# Patient Record
Sex: Female | Born: 1980 | Hispanic: Yes | Marital: Single | Smoking: Former smoker
Health system: Southern US, Community
[De-identification: ages and names within clinical notes are randomized; demographics above are authoritative.]

## PROBLEM LIST (undated history)

## (undated) ENCOUNTER — Inpatient Hospital Stay (HOSPITAL_COMMUNITY): Payer: Self-pay

## (undated) DIAGNOSIS — Z789 Other specified health status: Secondary | ICD-10-CM

## (undated) DIAGNOSIS — K8020 Calculus of gallbladder without cholecystitis without obstruction: Secondary | ICD-10-CM

---

## 2015-04-06 ENCOUNTER — Other Ambulatory Visit (HOSPITAL_COMMUNITY): Payer: Self-pay | Admitting: Nurse Practitioner

## 2015-04-06 DIAGNOSIS — Z3A13 13 weeks gestation of pregnancy: Secondary | ICD-10-CM

## 2015-04-06 DIAGNOSIS — Z3682 Encounter for antenatal screening for nuchal translucency: Secondary | ICD-10-CM

## 2015-04-06 LAB — OB RESULTS CONSOLE HGB/HCT, BLOOD
HCT: 38 %
Hemoglobin: 12.2 g/dL

## 2015-04-06 LAB — OB RESULTS CONSOLE GC/CHLAMYDIA
Chlamydia: NEGATIVE
Gonorrhea: NEGATIVE

## 2015-04-06 LAB — OB RESULTS CONSOLE ABO/RH: RH TYPE: POSITIVE

## 2015-04-06 LAB — URINE DRUG PANEL 7: Drug Screen, Urine: NEGATIVE

## 2015-04-06 LAB — CYTOLOGY - PAP: CYTOLOGY - PAP: NEGATIVE

## 2015-04-06 LAB — GLUCOSE TOLERANCE, 1 HOUR (50G) W/O FASTING: GLUCOSE 1 HR PRENATAL, POC: 97 mg/dL

## 2015-04-06 LAB — OB RESULTS CONSOLE PLATELET COUNT: PLATELETS: 295 10*3/uL

## 2015-04-06 LAB — SICKLE CELL SCREEN: Sickle Cell Screen: NORMAL

## 2015-04-06 LAB — OB RESULTS CONSOLE HIV ANTIBODY (ROUTINE TESTING): HIV: NONREACTIVE

## 2015-04-06 LAB — OB RESULTS CONSOLE ANTIBODY SCREEN: ANTIBODY SCREEN: NEGATIVE

## 2015-04-06 LAB — CULTURE, OB URINE: URINE CULTURE, OB: NO GROWTH

## 2015-04-06 LAB — OB RESULTS CONSOLE RUBELLA ANTIBODY, IGM: RUBELLA: IMMUNE

## 2015-04-06 LAB — OB RESULTS CONSOLE VARICELLA ZOSTER ANTIBODY, IGG: VARICELLA IGG: IMMUNE

## 2015-04-06 LAB — CYSTIC FIBROSIS DIAGNOSTIC STUDY: Interpretation-CFDNA:: NEGATIVE

## 2015-04-18 ENCOUNTER — Encounter (HOSPITAL_COMMUNITY): Payer: Self-pay

## 2015-04-18 ENCOUNTER — Ambulatory Visit (HOSPITAL_COMMUNITY)
Admission: RE | Admit: 2015-04-18 | Discharge: 2015-04-18 | Disposition: A | Discharge status: Home / Self Care | Payer: Self-pay | Source: Ambulatory Visit | Attending: Nurse Practitioner | Admitting: Nurse Practitioner

## 2015-04-18 DIAGNOSIS — O09521 Supervision of elderly multigravida, first trimester: Secondary | ICD-10-CM | POA: Insufficient documentation

## 2015-04-18 DIAGNOSIS — Z3682 Encounter for antenatal screening for nuchal translucency: Secondary | ICD-10-CM

## 2015-04-18 DIAGNOSIS — Z36 Encounter for antenatal screening of mother: Secondary | ICD-10-CM | POA: Insufficient documentation

## 2015-04-18 DIAGNOSIS — Z3A13 13 weeks gestation of pregnancy: Secondary | ICD-10-CM | POA: Insufficient documentation

## 2015-04-18 DIAGNOSIS — O3421 Maternal care for scar from previous cesarean delivery: Secondary | ICD-10-CM | POA: Insufficient documentation

## 2015-04-18 LAB — FIRST TRIMESTER SCREEN W/NT: FIRST TRIMESTER SAMPLE: NEGATIVE

## 2015-04-22 ENCOUNTER — Encounter (HOSPITAL_COMMUNITY): Payer: Self-pay | Admitting: *Deleted

## 2015-04-22 ENCOUNTER — Inpatient Hospital Stay (HOSPITAL_COMMUNITY)
Admission: AD | Admit: 2015-04-22 | Discharge: 2015-04-22 | Disposition: A | Discharge status: Home / Self Care | Payer: Self-pay | Source: Ambulatory Visit | Attending: Obstetrics and Gynecology | Admitting: Obstetrics and Gynecology

## 2015-04-22 DIAGNOSIS — O2342 Unspecified infection of urinary tract in pregnancy, second trimester: Secondary | ICD-10-CM | POA: Insufficient documentation

## 2015-04-22 DIAGNOSIS — Z87891 Personal history of nicotine dependence: Secondary | ICD-10-CM | POA: Insufficient documentation

## 2015-04-22 DIAGNOSIS — Z3A14 14 weeks gestation of pregnancy: Secondary | ICD-10-CM | POA: Insufficient documentation

## 2015-04-22 HISTORY — DX: Other specified health status: Z78.9

## 2015-04-22 LAB — WET PREP, GENITAL
Clue Cells Wet Prep HPF POC: NONE SEEN
TRICH WET PREP: NONE SEEN
Yeast Wet Prep HPF POC: NONE SEEN

## 2015-04-22 LAB — URINE MICROSCOPIC-ADD ON

## 2015-04-22 LAB — URINALYSIS, ROUTINE W REFLEX MICROSCOPIC
BILIRUBIN URINE: NEGATIVE
GLUCOSE, UA: NEGATIVE mg/dL
KETONES UR: NEGATIVE mg/dL
NITRITE: NEGATIVE
PH: 6.5 (ref 5.0–8.0)
PROTEIN: 30 mg/dL — AB
Specific Gravity, Urine: 1.015 (ref 1.005–1.030)
UROBILINOGEN UA: 0.2 mg/dL (ref 0.0–1.0)

## 2015-04-22 MED ORDER — CEPHALEXIN 500 MG PO CAPS: 500.0000 mg | ORAL_CAPSULE | Freq: Four times a day (QID) | ORAL | Status: DC

## 2015-04-22 NOTE — Progress Notes (Signed)
Assisted RN with interpretation of exam.  Spanish Interpreter

## 2015-04-22 NOTE — Discharge Instructions (Signed)
Infección urinaria  °(Urinary Tract Infection) ° Una infección urinaria puede ocurrir en cualquier lugar del tracto urinario. El tracto incluye los riñones, uréteres, la vejiga y la uretra. La causa es un germen llamado bacteria. La infección urinaria mejora con antibióticos.  °CUIDADOS EN EL HOGAR  °· Si le recetaron antibióticos, tómelos como le haya indicado el médico. Tómelos todos, aunque se sienta mejor. °· Beba gran cantidad de líquido para mantener el pis (orina) de tono claro o amarillo pálido. °· Evite el té, las bebidas con cafeína y las bebidas gaseosas (carbonatada). °· Orine con frecuencia. Evite retener la orina durante mucho tiempo. °· Orine antes y después de tener sexo (relaciones sexuales). °· Si es mujer, higienícese desde adelante hacia atrás después de ir de cuerpo (mover el intestino). Use sólo un papel tissue por vez. °SOLICITE AYUDA DE INMEDIATO SI:  °· Siente dolor en la espalda. °· Siente un dolor en el vientre (abdominal) muy intenso. °· Tiene escalofríos. °· Tiene malestar estomacal (náuseas). °· Vomita. °· El ardor o las molestias al orinar no desaparecen. °· Tiene fiebre. °· Los síntomas no mejoran después de 3 días. °ASEGÚRESE DE QUE:  °· Comprende estas instrucciones. °· Controlará su enfermedad. °· Solicitará ayuda de inmediato si no mejora o si empeora. °Document Released: 04/17/2010 Document Revised: 07/22/2012 °ExitCare® Patient Information ©2015 [employer] LLC. This information is not intended to replace advice given to you by your health care provider. Make sure you discuss any questions you have with your health care provider. ° °

## 2015-04-22 NOTE — Progress Notes (Signed)
Assisted Pharmacy tech with interpretation of medication questions.  Spanish Interpreter

## 2015-04-22 NOTE — MAU Provider Note (Signed)
History     CSN: [id number]  Arrival date and time: 04/22/15 1811   First Provider Initiated Contact with Patient 04/22/15 1840      Chief Complaint  Patient presents with  . Dysuria   HPI  [name] is a 34 y.o. G3P2002 at [redacted]w[redacted]d who presents to MAU today with complaint of dysuria x 1 day. She also states pelvic pain which initially was only with urination is now constant x 2 hours. She rates pain at 10/10 now. She has not taken anything for pain. She denies N/V/D, hematuria or vaginal bleeding.   OB History    Gravida Para Term Preterm AB TAB SAB Ectopic Multiple Living   3 2 2  0 0 0 0 0 0 2      Past Medical History  Diagnosis Date  . Medical history non-contributory     Past Surgical History  Procedure Laterality Date  . Cesarean section      History reviewed. No pertinent family history.  History  Substance Use Topics  . Smoking status: Former Smoker    Quit date: 11/21/2012  . Smokeless tobacco: Not on file  . Alcohol Use: No    Allergies: No Known Allergies  Prescriptions prior to admission  Medication Sig Dispense Refill Last Dose  . Prenatal Vit-Fe Fumarate-FA (PRENATAL MULTIVITAMIN) TABS tablet Take 1 tablet by mouth daily at 12 noon.   04/22/2015 at Unknown time    Review of Systems  Constitutional: Negative for fever and malaise/fatigue.  Gastrointestinal: Positive for abdominal pain. Negative for nausea, vomiting, diarrhea and constipation.  Genitourinary: Positive for dysuria. Negative for urgency, frequency, hematuria and flank pain.       Neg - vaginal bleeding, discharge   Physical Exam   Blood pressure 125/74, pulse 86, temperature 98.7 F (37.1 C), resp. rate 18, last menstrual period 01/12/2015.  Physical Exam  Nursing note and vitals reviewed. Constitutional: She is oriented to person, place, and time. She appears well-developed and well-nourished. No distress.  HENT:  Head: Normocephalic and atraumatic.   Cardiovascular: Normal rate.   Respiratory: Effort normal.  GI: Soft. She exhibits no distension and no mass. There is no tenderness. There is no rebound, no guarding and no CVA tenderness.  Genitourinary: Uterus is enlarged (appropriate for GA). Uterus is not tender. Cervix exhibits no motion tenderness, no discharge and no friability. Right adnexum displays no mass and no tenderness. Left adnexum displays no mass and no tenderness. No bleeding in the vagina. Vaginal discharge (small amount of thin, white discharge noted) found.  Neurological: She is alert and oriented to person, place, and time.  Skin: Skin is warm and dry. No erythema.  Psychiatric: She has a normal mood and affect.   Results for orders placed or performed during the hospital encounter of 04/22/15 (from the past 24 hour(s))  Urinalysis, Routine w reflex microscopic (not at [facility])     Status: Abnormal   Collection Time: 04/22/15  6:30 PM  Result Value Ref Range   Color, Urine YELLOW YELLOW   APPearance CLEAR CLEAR   Specific Gravity, Urine 1.015 1.005 - 1.030   pH 6.5 5.0 - 8.0   Glucose, UA NEGATIVE NEGATIVE mg/dL   Hgb urine dipstick MODERATE (A) NEGATIVE   Bilirubin Urine NEGATIVE NEGATIVE   Ketones, ur NEGATIVE NEGATIVE mg/dL   Protein, ur 30 (A) NEGATIVE mg/dL   Urobilinogen, UA 0.2 0.0 - 1.0 mg/dL   Nitrite NEGATIVE NEGATIVE   Leukocytes, UA MODERATE (A) NEGATIVE  Urine  microscopic-add on     Status: Abnormal   Collection Time: 04/22/15  6:30 PM  Result Value Ref Range   Squamous Epithelial / LPF FEW (A) RARE   WBC, UA 7-10 <3 WBC/hpf   RBC / HPF 11-20 <3 RBC/hpf   Bacteria, UA FEW (A) RARE    MAU Course  Procedures None  MDM FHR - 171 bpm with doppler UA and wet prep today Urine culture ordered  Assessment and Plan  A: SIUP at [redacted]w[redacted]d UTI in pregnancy  P: Discharge home Rx for Keflex given to patient Second trimester precautions discussed Patient advised to follow-up with [facility] as scheduled  for routine prenatal care Patient may return to MAU as needed or if her condition were to change or worsen   Marny Lowenstein, PA-C  04/22/2015, 8:07 PM

## 2015-04-22 NOTE — Progress Notes (Signed)
Assisted RN with interpretation of discharge instructions.  °Spanish Interpreter  °

## 2015-04-22 NOTE — MAU Note (Signed)
Pt reports urinary frequency and pain for the last 2 hours.

## 2015-04-22 NOTE — Progress Notes (Signed)
Assisted RN with interpretation of triage questions.  °Spanish Interpreter  °

## 2015-04-22 NOTE — Progress Notes (Signed)
Assisted Registration with interpretation of admission information.  Spanish interpreter

## 2015-04-24 LAB — CULTURE, OB URINE: Colony Count: >=100000

## 2015-05-03 ENCOUNTER — Other Ambulatory Visit (HOSPITAL_COMMUNITY): Payer: Self-pay | Admitting: Family

## 2015-05-04 ENCOUNTER — Other Ambulatory Visit (HOSPITAL_COMMUNITY): Payer: Self-pay | Admitting: Nurse Practitioner

## 2015-05-04 DIAGNOSIS — IMO0002 Reserved for concepts with insufficient information to code with codable children: Secondary | ICD-10-CM

## 2015-05-04 DIAGNOSIS — Z0489 Encounter for examination and observation for other specified reasons: Secondary | ICD-10-CM

## 2015-05-04 LAB — AFP, QUAD SCREEN: AFP, SERUM MAT SCREEN: NEGATIVE

## 2015-05-25 ENCOUNTER — Ambulatory Visit (HOSPITAL_COMMUNITY)
Admission: RE | Admit: 2015-05-25 | Discharge: 2015-05-25 | Disposition: A | Discharge status: Home / Self Care | Payer: Self-pay | Source: Ambulatory Visit | Attending: Nurse Practitioner | Admitting: Nurse Practitioner

## 2015-05-25 ENCOUNTER — Other Ambulatory Visit (HOSPITAL_COMMUNITY): Payer: Self-pay | Admitting: Nurse Practitioner

## 2015-05-25 DIAGNOSIS — Z0489 Encounter for examination and observation for other specified reasons: Secondary | ICD-10-CM

## 2015-05-25 DIAGNOSIS — IMO0002 Reserved for concepts with insufficient information to code with codable children: Secondary | ICD-10-CM

## 2015-05-25 DIAGNOSIS — Z36 Encounter for antenatal screening of mother: Secondary | ICD-10-CM | POA: Insufficient documentation

## 2015-05-25 DIAGNOSIS — Z3A00 Weeks of gestation of pregnancy not specified: Secondary | ICD-10-CM | POA: Insufficient documentation

## 2015-06-11 ENCOUNTER — Inpatient Hospital Stay (HOSPITAL_COMMUNITY)
Admission: AD | Admit: 2015-06-11 | Discharge: 2015-06-11 | Disposition: A | Discharge status: Home / Self Care | Payer: Self-pay | Source: Ambulatory Visit | Attending: Family Medicine | Admitting: Family Medicine

## 2015-06-11 ENCOUNTER — Encounter (HOSPITAL_COMMUNITY): Payer: Self-pay | Admitting: *Deleted

## 2015-06-11 DIAGNOSIS — Z87891 Personal history of nicotine dependence: Secondary | ICD-10-CM | POA: Insufficient documentation

## 2015-06-11 DIAGNOSIS — Z3A21 21 weeks gestation of pregnancy: Secondary | ICD-10-CM | POA: Insufficient documentation

## 2015-06-11 DIAGNOSIS — O2342 Unspecified infection of urinary tract in pregnancy, second trimester: Secondary | ICD-10-CM | POA: Insufficient documentation

## 2015-06-11 LAB — CBC
HCT: 33.8 % — ABNORMAL LOW (ref 36.0–46.0)
Hemoglobin: 11.2 g/dL — ABNORMAL LOW (ref 12.0–15.0)
MCH: 26.4 pg (ref 26.0–34.0)
MCHC: 33.1 g/dL (ref 30.0–36.0)
MCV: 79.7 fL (ref 78.0–100.0)
PLATELETS: 291 10*3/uL (ref 150–400)
RBC: 4.24 MIL/uL (ref 3.87–5.11)
RDW: 14 % (ref 11.5–15.5)
WBC: 16.3 10*3/uL — ABNORMAL HIGH (ref 4.0–10.5)

## 2015-06-11 LAB — URINALYSIS, ROUTINE W REFLEX MICROSCOPIC
Bilirubin Urine: NEGATIVE
GLUCOSE, UA: NEGATIVE mg/dL
KETONES UR: NEGATIVE mg/dL
NITRITE: POSITIVE — AB
PH: 7.5 (ref 5.0–8.0)
Protein, ur: 100 mg/dL — AB
SPECIFIC GRAVITY, URINE: 1.02 (ref 1.005–1.030)
Urobilinogen, UA: 0.2 mg/dL (ref 0.0–1.0)

## 2015-06-11 LAB — URINE MICROSCOPIC-ADD ON

## 2015-06-11 MED ORDER — NITROFURANTOIN MONOHYD MACRO 100 MG PO CAPS: 100.0000 mg | ORAL_CAPSULE | Freq: Two times a day (BID) | ORAL | Status: DC

## 2015-06-11 MED ORDER — CEPHALEXIN 500 MG PO CAPS: 500.0000 mg | ORAL_CAPSULE | Freq: Four times a day (QID) | ORAL | Status: DC

## 2015-06-11 MED ORDER — CEFTRIAXONE SODIUM 1 G IJ SOLR
1.0000 g | Freq: Once | INTRAMUSCULAR | Status: AC
  Administered 2015-06-11: 1 g via INTRAMUSCULAR
  Filled 2015-06-11: qty 10

## 2015-06-11 NOTE — Discharge Instructions (Signed)
Please return here to hospital in 24 hours (Monday morning around 8am) for re-evaluation If you develop a fever or chills, nausea and vomiting or severe back/flank pain, please return earlier  [place] e infeccin del tracto urinario (Pregnancy and Urinary Tract Infection) Neomia Dear infeccin urinaria (IU) puede ocurrir en cualquier lugar del tracto urinario. La infeccin urinaria puede [employer] utteres, los riones (pielonefritis), la vejiga (cistitis) y Engineer, mining (uretritis). Todas las mujeres embarazadas deben ser estudiadas para diagnosticar la presencia de bacterias en el tracto urinario. La identificacin y el tratamiento de una infeccin urinaria disminuye el riesgo de un parto prematuro y de Environmental education officer infecciones ms graves en la madre y el beb. CAUSAS Las bacterias causan casi todas las infecciones urinarias.  FACTORES DE RIESGO Hay muchos factores que pueden aumentar sus probabilidades de contraer una infeccin urinaria (IU) durante el [place]. Pueden ser:  Winferd Humphrey uretra corta.  Falta de aseo y malos hbitos de higiene.  Las [employer].  Obstruccin de la orina en el tracto urinario.  Problemas con los msculos o nervios plvicos.  Diabetes.  Obesidad.  Problemas en la vejiga despus de tener varios hijos.  Antecedentes de infeccin urinaria. SIGNOS Y SNTOMAS   Dolor, ardor o sensacin de ardor al [employer].  Sentir la necesidad de Geographical information systems officer de inmediato [place]).  Prdida del control vesical (incontinencia urinaria).  Orinar con ms frecuencia de lo comn en el embarazo.  Malestar en la zona inferior del abdomen o en la espalda.  Mason Jim turbia.  Sangre en la orina (hematuria).  Grant Ruts. Cuando se infectan los riones, los sntomas pueden ser:  Dolor de espalda.  Dolor lateral en el lado derecho ms que en el lado izquierdo.  Grant Ruts.  Escalofros.  Nuseas.  Vmitos. DIAGNSTICO  Una infeccin del tracto urinario se suele diagnosticar  a travs de la orina. A veces se realizan pruebas y procedimientos adicionales. Estos pueden ser:  [place] de los riones, los urteres, la vejiga y Engineer, mining.  Observar la vejiga con un tubo que ilumina (cistoscopa). TRATAMIENTO Por lo general, las IU pueden tratarse con medicamentos antibiticos.  INSTRUCCIONES PARA EL CUIDADO EN EL HOGAR   Tome slo medicamentos de venta libre o recetados, segn las indicaciones del mdico. Si le han recetado antibiticos, tmelos segn las indicaciones. Tmelos todos, aunque se sienta mejor.  Beba suficiente lquido para Photographer orina clara o de color amarillo plido.  No tenga relaciones sexuales hasta que la infeccin haya desaparecido o el mdico la autorice.  Asegrese de [employer] hagan estudios para Engineer, manufacturing una infeccin urinaria durante el [place]. Estas infecciones suelen reaparecer. Para prevenir una infeccin urinaria en el futuro  Practique buenos hbitos higinicos. Siempre debe limpiarse desde adelante hacia atrs. Use el tissue slo una vez.  No retenga la orina. Orine tan pronto como sea posible cuando tenga ganas.  No se haga duchas vaginales ni use desodorantes en aerosol.  Lave con agua tibia y jabn alrededor de la zona genital y el ano.  Vace la vejiga antes y despus de Management consultant.  Use ropa interior con algodn en la entrepierna.  Evite la cafena y las [address]. Estas sustancias irritan la vejiga.  Beba jugo de arndanos o tome comprimidos de arndano. Esto puede disminuir el riesgo de sufrir una infeccin urinaria.  No beba alcohol.  Cumpla con las visitas de control y hgase todos los anlisis segn lo programado. SOLICITE ATENCIN MDICA SI:   Los sntomas empeoran.  Tiene fiebre an despus de  2 das de [address].  Tiene una erupcin.  Siente que usted tiene problemas con los medicamentos recetados.  Tiene flujo vaginal anormal. SOLICITE ATENCIN MDICA DE  INMEDIATO SI:   Siente dolor en la espalda o a los lados.  Tiene escalofros.  Observa sangre en la orina.  Tiene nuseas o vmitos.  Siente contracciones en el tero.  Tiene una perdida de lquido en chorro por la vagina. ASEGRESE DE QUE:  Comprende estas instrucciones.   Controlar su afeccin.   Recibir ayuda de inmediato si no mejora o si empeora.  Document Released: 07/22/2012 Document Revised: 08/18/2013 [facility] Patient Information 2015 [place], [place]. This information is not intended to replace advice given to you by your health care provider. Make sure you discuss any questions you have with your health care provider.  Pielonefritis - Adultos  (Pyelonephritis, Adult)  La pielonefritis es una infeccin del rin. Hay dos tipos principales de pielonefritis:   Una infeccin que se inicia rpidamente sin sntomas previos (pielonefritis [place]).  Infecciones que persisten por un largo perodo (pielonefritis crnica). CAUSAS  Hay dos causas principales:   Pasaje de bacterias desde la vejiga al rin. Este problema aparece especialmente en mujeres embarazadas. La orina en la vejiga puede infectarse por diferentes causas, por ejemplo:  Inflamacin de la prstata (prostatitis).  Durante las [employer].  Infeccin en la vejiga (cistitis).  Pasaje de bacterias desde la sangre hacia el rin. Las enfermedades que aumentan el riesgo son:   Diabetes.  Clculos renales o en la vescula.  Cncer.  Un catter colocado en la vejiga.  Otras anormalidades del rin o de Engineer, mining. SNTOMAS   Dolor abdominal  Dolor en la zona del costado o flanco.  Grant Ruts.  Escalofros.  Malestar estomacal.  Sangre en la orina Larose Kells).  Necesidad frecuente de orinar  Necesidad intensa o persistente de Geographical information systems officer.  Sensacin de ardor o pinchazos al [employer]. DIAGNSTICO  El mdico diagnosticar una infeccin en su rin basndose en los  sntomas. Tambin tomar [place].  TRATAMIENTO  Generalmente el tratamiento depende de la gravedad de la infeccin.   Si la infeccin es leve y se diagnostica a tiempo, el mdico lo tratar con antibiticos por va oral y lo dejar irse a su casa.  Si la infeccin es ms grave, la bacteria podra haber ingresado al torrente sanguneo. Esto requerir antibiticos por va intravenosa y Administrator, arts en el hospital. Los sntomas pueden incluir:  Fiebre alta.  Dolor intenso en un costado del cuerpo.  Escalofros  An despus de Financial risk analyst hospital, el mdico podr indicarle antibiticos por va oral durante cierto perodo de [place].  Podr prescribirle otros tratamientos segn la causa de la infeccin. INSTRUCCIONES PARA EL CUIDADO EN EL HOGAR   Tome los antibiticos como se le indic. Tmelos todos, aunque se sienta mejor.  Concurra para Education officer, environmental un control y asegurarse de que la infeccin ha desaparecido.  Debe ingerir gran cantidad de lquido para mantener la orina de tono claro o color amarillo plido.  Tome medicamentos para la vejiga si siente urgencia para Geographical information systems officer o lo hace con mucha frecuencia. SOLICITE ATENCIN MDICA DE INMEDIATO SI:   Tiene fiebre o sntomas persistentes durante ms de 2  3 das.  Tiene fiebre y los sntomas [address].  No puede tomar los antibiticos ni ingerir lquidos.  Comienza a sentir escalofros.  Siente debilidad extrema o se desmaya.  No mejora despus de 2 das de [place]. ASEGRESE  DE QUE:   Comprende estas instrucciones.  Controlar su enfermedad.  Solicitar ayuda de inmediato si no mejora o empeora. Document Released: 08/07/2005 Document Revised: 04/28/2012 [facility] Patient Information 2015 [place], [place]. This information is not intended to replace advice given to you by your health care provider. Make sure you discuss any questions you have with your health care provider.

## 2015-06-11 NOTE — MAU Note (Signed)
EXPLAINED  TO PT  WITH INTERPRETER-   OF WATCHING X2 MORE HRS  FOR INCREASE TEMP

## 2015-06-11 NOTE — MAU Provider Note (Signed)
History  Chief Complaint:  No chief complaint on file.  Madeline Stark is a 34 y.o. G55P2002 female at [redacted]w[redacted]d presenting w/ report of constant lower abdominal pain, low back pain, & urinary frequency/dysuria x 1 day.  Denies fever/chills, n/v.  Reports active fetal movement, contractions: none, vaginal bleeding: none, membranes: intact. Denies abnormal/malodorous vag d/c or vulvovaginal itching/irritation.   Prenatal care at HD.  Next visit 8/15. Pregnancy complicated by UTI in June, tx w/ keflex and pt feels like it did resolve, 2 prev c/s in [place], language barrier.   Obstetrical History: OB History    Gravida Para Term Preterm AB TAB SAB Ectopic Multiple Living   0 0 0 0 0 0 2      Past Medical History: Past Medical History  Diagnosis Date  . Medical history non-contributory     Past Surgical History: Past Surgical History  Procedure Laterality Date  . Cesarean section      Social History: History   Social History  . Marital Status: Single    Spouse Name: N/A  . Number of Children: N/A  . Years of Education: N/A   Social History Main Topics  . Smoking status: Former Smoker    Quit date: 11/21/2012  . Smokeless tobacco: Not on file  . Alcohol Use: No  . Drug Use: No  . Sexual Activity: No   Other Topics Concern  . None   Social History Narrative    Allergies: No Known Allergies  Prescriptions prior to admission  Medication Sig Dispense Refill Last Dose  . Prenatal Vit-Fe Fumarate-FA (PRENATAL MULTIVITAMIN) TABS tablet Take 1 tablet by mouth daily at 12 noon.   06/10/2015 at Unknown time  . cephALEXin (KEFLEX) 500 MG capsule Take 1 capsule (500 mg total) by mouth 4 (four) times daily. 28 capsule 0 More than a month at Unknown time    Review of Systems  Pertinent pos/neg as indicated in HPI  Physical Exam  Blood pressure 109/61, pulse 91, temperature 97.9 F (36.6 C), temperature source Oral, resp. rate 20, height  (1.499 m), weight  79.379 kg (175 lb), last menstrual period 01/12/2015. General appearance: alert, cooperative and no distress Lungs: clear to auscultation bilaterally, normal effort Heart: regular rate and rhythm Abdomen: gravid, soft, slightly tender Mild Rt CVAT Extremities: No edema Skin feels hot to touch, re-checked oral temp: 98.6, axillary temp 98.9  Fetal monitoring: FHR: 150 bpm, variability: moderate,  Accelerations: Present,  decelerations:  Absent Uterine activity: occ ui  MAU Course  Exam, ua, wbc 1610: Discussed w/ Dr. Shawnie Pons, can watch for a couple of more hours and make sure she doesn't spike temp, if doesn't can give Rocephin IM x 1 and send home w/ po antbiotics to f/u back in [place] in 24hr for re-eval, if she does spike temp she will be admitted 0800: Did not spike fever, will d/c home after rocephin 1gm im Labs:  Results for orders placed or performed during the hospital encounter of 06/11/15 (from the past 24 hour(s))  Urinalysis, Routine w reflex microscopic (not at [facility])     Status: Abnormal   Collection Time: 06/11/15  4:18 AM  Result Value Ref Range   Color, Urine YELLOW YELLOW   APPearance CLOUDY (A) CLEAR   Specific Gravity, Urine 1.020 1.005 - 1.030   pH 7.5 5.0 - 8.0   Glucose, UA NEGATIVE NEGATIVE mg/dL   Hgb urine dipstick LARGE (A) NEGATIVE   Bilirubin Urine NEGATIVE NEGATIVE  Ketones, ur NEGATIVE NEGATIVE mg/dL   Protein, ur 161 (A) NEGATIVE mg/dL   Urobilinogen, UA 0.2 0.0 - 1.0 mg/dL   Nitrite POSITIVE (A) NEGATIVE   Leukocytes, UA LARGE (A) NEGATIVE  Urine microscopic-add on     Status: Abnormal   Collection Time: 06/11/15  4:18 AM  Result Value Ref Range   Squamous Epithelial / LPF RARE RARE   WBC, UA TOO NUMEROUS TO COUNT <3 WBC/hpf   RBC / HPF 11-20 <3 RBC/hpf   Bacteria, UA FEW (A) RARE  CBC     Status: Abnormal   Collection Time: 06/11/15  5:10 AM  Result Value Ref Range   WBC 16.3 (H) 4.0 - 10.5 K/uL   RBC 4.24 3.87 - 5.11 MIL/uL   Hemoglobin 11.2  (L) 12.0 - 15.0 g/dL   HCT 09.6 (L) 04.5 - 40.9 %   MCV 79.7 78.0 - 100.0 fL   MCH 26.4 26.0 - 34.0 pg   MCHC 33.1 30.0 - 36.0 g/dL   RDW 81.1 91.4 - 78.2 %   Platelets 291 150 - 400 K/uL    Imaging:  n/a  Assessment and Plan  A:  [redacted]w[redacted]d SIUP  G3P2002  UTI vs. Early pyelo  Cat 1 FHR P:  D/C home after Rocephin 1gm IM  F/U here in 24hrs for re-eval per Dr. Shawnie Pons  Rx keflex qid x 10d  Reviewed ptl s/s, fm, pyleo s/s, reasons to return  Increase po fluids  Keep next appt at HD on 8/15 as scheduled   Marge Duncans CNM,WHNP-BC 7/31/20165:52 AM

## 2015-06-11 NOTE — MAU Note (Addendum)
PT  SAYS S WITH INTERPRETER- RAQUEL-    THAT RIGHT   SIDED  CONSTANT   ABD PAIN  STARTED  AT 5 PM YESTERDAY  WHEN SHE LAYED  DOWN -   PAIN IS  WORSE  NOW -  CONSTANT    ALL OVER ABD  .    [facility]  AT HD.    DENIES HSV AND MRSA.  LAST SEX-    YESTERDAY  .

## 2015-06-12 ENCOUNTER — Encounter (HOSPITAL_COMMUNITY): Payer: Self-pay | Admitting: *Deleted

## 2015-06-12 ENCOUNTER — Inpatient Hospital Stay (HOSPITAL_COMMUNITY)
Admission: AD | Admit: 2015-06-12 | Discharge: 2015-06-12 | Disposition: A | Discharge status: Home / Self Care | Payer: Self-pay | Source: Ambulatory Visit | Attending: Family Medicine | Admitting: Family Medicine

## 2015-06-12 DIAGNOSIS — Z87891 Personal history of nicotine dependence: Secondary | ICD-10-CM | POA: Insufficient documentation

## 2015-06-12 DIAGNOSIS — N390 Urinary tract infection, site not specified: Secondary | ICD-10-CM | POA: Diagnosis present

## 2015-06-12 DIAGNOSIS — Z3A21 21 weeks gestation of pregnancy: Secondary | ICD-10-CM | POA: Insufficient documentation

## 2015-06-12 DIAGNOSIS — N10 Acute tubulo-interstitial nephritis: Secondary | ICD-10-CM

## 2015-06-12 DIAGNOSIS — O2342 Unspecified infection of urinary tract in pregnancy, second trimester: Secondary | ICD-10-CM | POA: Insufficient documentation

## 2015-06-12 LAB — URINE MICROSCOPIC-ADD ON

## 2015-06-12 LAB — URINALYSIS, ROUTINE W REFLEX MICROSCOPIC
Bilirubin Urine: NEGATIVE
Glucose, UA: 100 mg/dL — AB
Hgb urine dipstick: NEGATIVE
Ketones, ur: 15 mg/dL — AB
Nitrite: NEGATIVE
Protein, ur: NEGATIVE mg/dL
Specific Gravity, Urine: 1.02 (ref 1.005–1.030)
Urobilinogen, UA: 1 mg/dL (ref 0.0–1.0)
pH: 6 (ref 5.0–8.0)

## 2015-06-12 NOTE — Discharge Instructions (Signed)
Embarazo e infeccin del tracto urinario (Pregnancy and Urinary Tract Infection) Una infeccin urinaria (IU) puede ocurrir en Clinical cytogeneticist del tracto urinario. La infeccin urinaria puede [employer] utteres, los riones (pielonefritis), la vejiga (cistitis) y Geologist, engineering (uretritis). Todas las mujeres embarazadas deben ser estudiadas para diagnosticar la presencia de bacterias en el tracto urinario. La identificacin y el tratamiento de una infeccin urinaria disminuye el riesgo de un parto prematuro y de Actor infecciones ms graves en la madre y el beb. CAUSAS Las bacterias causan casi todas las infecciones urinarias.  FACTORES DE RIESGO Hay muchos factores que pueden aumentar sus probabilidades de contraer una infeccin urinaria (IU) durante el [place]. Pueden ser:  Lucilla Edin uretra corta.  Falta de aseo y malos hbitos de higiene.  [place].  Obstruccin de la orina en el tracto urinario.  Problemas con los msculos o nervios plvicos.  Diabetes.  Obesidad.  Problemas en la vejiga despus de tener varios hijos.  Antecedentes de infeccin urinaria. SIGNOS Y SNTOMAS   Dolor, ardor o sensacin de ardor al [employer].  Sentir la necesidad de Garment/textile technologist de inmediato [place]).  Prdida del control vesical (incontinencia urinaria).  Orinar con ms frecuencia de lo comn en el embarazo.  Malestar en la zona inferior del abdomen o en la espalda.  Bennie Hind turbia.  Sangre en la orina (hematuria).  Cristy Hilts. Cuando se infectan los riones, los sntomas pueden ser:  Dolor de espalda.  Dolor lateral en el lado derecho ms que en el lado izquierdo.  Cristy Hilts.  Escalofros.  Nuseas.  Vmitos. DIAGNSTICO  Una infeccin del tracto urinario se suele diagnosticar a travs de la orina. A veces se realizan pruebas y procedimientos adicionales. Estos pueden ser:  Steward Drone de los riones, los urteres, la vejiga y Geologist, engineering.  Observar la vejiga con un  tubo que ilumina (cistoscopa). TRATAMIENTO Por lo general, las IU pueden tratarse con medicamentos antibiticos.  INSTRUCCIONES PARA EL CUIDADO EN EL HOGAR   Tome slo medicamentos de venta libre o recetados, segn las indicaciones del mdico. Si le han recetado antibiticos, tmelos segn las indicaciones. Tmelos todos, aunque se sienta mejor.  Beba suficiente lquido para Consulting civil engineer orina clara o de color amarillo plido.  No tenga relaciones sexuales hasta que la infeccin haya desaparecido o el mdico la autorice.  Asegrese de [employer] hagan estudios para Hydrographic surveyor una infeccin urinaria durante el [place]. Estas infecciones suelen reaparecer. Para prevenir una infeccin urinaria en el futuro  Practique buenos hbitos higinicos. Siempre debe limpiarse desde adelante hacia atrs. Use el tissue slo una vez.  No retenga la orina. Orine tan pronto como sea posible cuando tenga ganas.  No se haga duchas vaginales ni use desodorantes en aerosol.  Lave con agua tibia y jabn alrededor de la zona genital y el ano.  Vace la vejiga antes y despus de Clinical biochemist.  Use ropa interior con algodn en la entrepierna.  Evite la cafena y las bebidas gaseosas. Estas sustancias irritan la vejiga.  Beba jugo de arndanos o tome comprimidos de arndano. Esto puede disminuir el riesgo de sufrir una infeccin urinaria.  No beba alcohol.  Cumpla con las visitas de control y hgase todos los anlisis segn lo programado. SOLICITE ATENCIN MDICA SI:   Los sntomas empeoran.  Tiene fiebre an despus de 2 das [place].  Tiene una erupcin.  Siente que usted tiene problemas con los medicamentos recetados.  Tiene flujo vaginal anormal. SOLICITE ATENCIN MDICA DE INMEDIATO SI:  Siente dolor en la espalda o a los lados. °· Tiene escalofríos. °· Observa sangre en la orina. °· Tiene náuseas o vómitos. °· Siente contracciones en el útero. °· Tiene una  perdida de líquido en chorro por la vagina. °ASEGÚRESE DE QUE: °· Comprende estas instrucciones.   °· Controlará su afección.   °· Recibirá ayuda de inmediato si no mejora o si empeora.   °Document Released: 07/22/2012 Document Revised: 08/18/2013 °ExitCare® Patient Information ©2015 [employer]. This information is not intended to replace advice given to you by your health care provider. Make sure you discuss any questions you have with your health care provider. ° °

## 2015-06-12 NOTE — MAU Note (Signed)
Pt was here yesterday, dx'd with UTI, received Rocephin.  Was told to F/U in MAU today.  Pt states she feels better today.  Continues to have lower back pain that she rates a 5, states it was much worse yesterday.   Denies bleeding or abd pain.

## 2015-06-12 NOTE — MAU Provider Note (Signed)
History     CSN: [id number]  Arrival date and time: 06/12/15 1042   First Provider Initiated Contact with Patient 06/12/15 1234      Chief Complaint  Patient presents with  . Back Pain   HPI  Patient is 34 y.o. [id number] [redacted]w[redacted]d seen yesterday in the MAU for UTI. Was given one dose of Rocephin and sent home with Keflex Rx. There was concern for possible pyelonephritis and pt was instructed to return today for f/u.    Patient denies fever or N/V overnight. Reports that low back pain and dysuria has improved. Has taken 4 doses of Keflex thus far.   +FM, denies LOF, VB, contractions, vaginal discharge.    OB History    Gravida Para Term Preterm AB TAB SAB Ectopic Multiple Living   3 2 2  0 0 0 0 0 0 2      Past Medical History  Diagnosis Date  . Medical history non-contributory     Past Surgical History  Procedure Laterality Date  . Cesarean section      History reviewed. No pertinent family history.  History  Substance Use Topics  . Smoking status: Former Smoker    Quit date: 11/21/2012  . Smokeless tobacco: Not on file  . Alcohol Use: No    Allergies: No Known Allergies  Prescriptions prior to admission  Medication Sig Dispense Refill Last Dose  . cephALEXin (KEFLEX) 500 MG capsule Take 1 capsule (500 mg total) by mouth 4 (four) times daily. X 10days 40 capsule 0 06/12/2015 at Unknown time  . Prenatal Vit-Fe Fumarate-FA (PRENATAL MULTIVITAMIN) TABS tablet Take 1 tablet by mouth daily at 12 noon.   06/12/2015 at Unknown time    Review of Systems  Constitutional: Negative for fever, chills and malaise/fatigue.  HENT: Negative for congestion.   Eyes: Negative for blurred vision and double vision.  Respiratory: Negative for cough and shortness of breath.   Cardiovascular: Negative for chest pain, palpitations, claudication and leg swelling.  Gastrointestinal: Negative for heartburn, nausea, vomiting, abdominal pain, diarrhea and constipation.  Genitourinary: Positive  for dysuria (improving ). Negative for hematuria.  Musculoskeletal: Negative for myalgias and back pain.  Skin: Negative for itching and rash.  Neurological: Negative for dizziness, loss of consciousness and headaches.   Physical Exam   Blood pressure 115/75, pulse 102, temperature 98.3 F (36.8 C), temperature source Oral, resp. rate 16, last menstrual period 01/12/2015, SpO2 100 %.  Physical Exam  Constitutional: She is oriented to person, place, and time. She appears well-developed and well-nourished. No distress.  HENT:  Head: Normocephalic and atraumatic.  Eyes: Conjunctivae and EOM are normal.  Neck: Normal range of motion. No thyromegaly present.  Cardiovascular: Normal rate, regular rhythm and normal heart sounds.  Exam reveals no gallop and no friction rub.   No murmur heard. Respiratory: Breath sounds normal. No respiratory distress. She has no wheezes. She has no rales.  GI: Soft. Bowel sounds are normal. She exhibits no distension. There is no tenderness. There is no rebound and no guarding.  Musculoskeletal: Normal range of motion. She exhibits no edema.  No CVA tenderness   Neurological: She is alert and oriented to person, place, and time.  Skin: Skin is warm and dry. No rash noted. No erythema.  Psychiatric: She has a normal mood and affect. Her behavior is normal.  Fetal heart tones 154 bpm   MAU Course  Procedures  MDM   Assessment and Plan  Patient is 34 y.o. G3P2002 [redacted]w[redacted]d  follow up for UTI.  - fetal kick counts reinforced - preterm labor precautions -unlikely that this is pyelonephritis due to lack of CVA tenderness, afebrile status, and lack of N/V  -instructed pt to continue Keflex for full course even if she is feeling better -instructed pt to return for worsening dysuria/back pain, fevers, and N/V -stable for discharge to home   De Hollingshead 06/12/2015, 12:34 PM   OB fellow attestation: I have seen and examined this patient; I agree with  above documentation in the resident's note.   Sharday Scrivener is a 34 y.o. [id number] presenting for follow up after MAU visit over the weekend with suspected pyelonephritis. She was given Rocephin and started on Keflex.  Denies fevers, chills, nausea, vomiting. Feels much better than on Saturday.  +FM, denies LOF, VB, contractions, vaginal discharge.  PE: BP 110/68 mmHg  Pulse 92  Temp(Src) 97.9 F (36.6 C) (Oral)  Resp 18  SpO2 100%  LMP 01/12/2015 Gen: calm comfortable, NAD Resp: normal effort, no distress Abd: gravid. No CVA tenderness. No suprapubic tenderness.   ROS, labs, PMH reviewed NST reactive   Plan: Urinary tract infection: Continue keflex, no need for repeat rocephin. Discussed return precautions in detail and patient voiced understanding.   Federico Flake, MD 1:06 PM

## 2015-06-20 ENCOUNTER — Other Ambulatory Visit (HOSPITAL_COMMUNITY): Payer: Self-pay | Admitting: Urology

## 2015-06-20 DIAGNOSIS — Z0489 Encounter for examination and observation for other specified reasons: Secondary | ICD-10-CM

## 2015-06-20 DIAGNOSIS — IMO0002 Reserved for concepts with insufficient information to code with codable children: Secondary | ICD-10-CM

## 2015-06-22 ENCOUNTER — Other Ambulatory Visit (HOSPITAL_COMMUNITY): Payer: Self-pay | Admitting: *Deleted

## 2015-06-22 ENCOUNTER — Ambulatory Visit (HOSPITAL_COMMUNITY)
Admission: RE | Admit: 2015-06-22 | Discharge: 2015-06-22 | Disposition: A | Discharge status: Home / Self Care | Payer: Self-pay | Source: Ambulatory Visit | Attending: Physician Assistant | Admitting: Physician Assistant

## 2015-06-22 ENCOUNTER — Other Ambulatory Visit (HOSPITAL_COMMUNITY): Payer: Self-pay | Admitting: Urology

## 2015-06-22 DIAGNOSIS — IMO0002 Reserved for concepts with insufficient information to code with codable children: Secondary | ICD-10-CM

## 2015-06-22 DIAGNOSIS — Z0489 Encounter for examination and observation for other specified reasons: Secondary | ICD-10-CM

## 2015-06-22 DIAGNOSIS — Z36 Encounter for antenatal screening of mother: Secondary | ICD-10-CM | POA: Insufficient documentation

## 2015-07-10 ENCOUNTER — Encounter (HOSPITAL_COMMUNITY): Payer: Self-pay

## 2015-07-10 ENCOUNTER — Inpatient Hospital Stay (HOSPITAL_COMMUNITY)
Admission: AD | Admit: 2015-07-10 | Discharge: 2015-07-10 | Disposition: A | Discharge status: Home / Self Care | Payer: Self-pay | Source: Ambulatory Visit | Attending: Obstetrics & Gynecology | Admitting: Obstetrics & Gynecology

## 2015-07-10 DIAGNOSIS — Z3A25 25 weeks gestation of pregnancy: Secondary | ICD-10-CM | POA: Insufficient documentation

## 2015-07-10 DIAGNOSIS — O26892 Other specified pregnancy related conditions, second trimester: Secondary | ICD-10-CM | POA: Insufficient documentation

## 2015-07-10 DIAGNOSIS — Z87891 Personal history of nicotine dependence: Secondary | ICD-10-CM | POA: Insufficient documentation

## 2015-07-10 DIAGNOSIS — R1013 Epigastric pain: Secondary | ICD-10-CM | POA: Insufficient documentation

## 2015-07-10 LAB — COMPREHENSIVE METABOLIC PANEL
ALBUMIN: 2.9 g/dL — AB (ref 3.5–5.0)
ALK PHOS: 90 U/L (ref 38–126)
ALT: 35 U/L (ref 14–54)
AST: 31 U/L (ref 15–41)
Anion gap: 10 (ref 5–15)
BUN: 11 mg/dL (ref 6–20)
CALCIUM: 9 mg/dL (ref 8.9–10.3)
CO2: 25 mmol/L (ref 22–32)
Chloride: 102 mmol/L (ref 101–111)
Creatinine, Ser: 0.64 mg/dL (ref 0.44–1.00)
GFR calc Af Amer: >60 mL/min (ref >60)
Glucose, Bld: 109 mg/dL — ABNORMAL HIGH (ref 65–99)
Potassium: 3.3 mmol/L — ABNORMAL LOW (ref 3.5–5.1)
Sodium: 137 mmol/L (ref 135–145)
TOTAL PROTEIN: 6.4 g/dL — AB (ref 6.5–8.1)
Total Bilirubin: 0.6 mg/dL (ref 0.3–1.2)

## 2015-07-10 LAB — CBC
HCT: 33.3 % — ABNORMAL LOW (ref 36.0–46.0)
HEMOGLOBIN: 10.9 g/dL — AB (ref 12.0–15.0)
MCH: 25.8 pg — ABNORMAL LOW (ref 26.0–34.0)
MCHC: 32.7 g/dL (ref 30.0–36.0)
MCV: 78.9 fL (ref 78.0–100.0)
Platelets: 265 10*3/uL (ref 150–400)
RBC: 4.22 MIL/uL (ref 3.87–5.11)
RDW: 14 % (ref 11.5–15.5)
WBC: 10.5 10*3/uL (ref 4.0–10.5)

## 2015-07-10 LAB — TROPONIN I

## 2015-07-10 LAB — LIPASE, BLOOD: LIPASE: 22 U/L (ref 22–51)

## 2015-07-10 MED ORDER — OXYCODONE-ACETAMINOPHEN 5-325 MG PO TABS: 1.0000 | ORAL_TABLET | ORAL | Status: DC | PRN

## 2015-07-10 MED ORDER — MORPHINE SULFATE (PF) 4 MG/ML IV SOLN
1.0000 mg | Freq: Once | INTRAVENOUS | Status: AC
  Administered 2015-07-10: 1 mg via INTRAVENOUS
  Filled 2015-07-10: qty 1

## 2015-07-10 MED ORDER — ONDANSETRON 4 MG PO TBDP: 4.0000 mg | ORAL_TABLET | Freq: Three times a day (TID) | ORAL | Status: DC | PRN

## 2015-07-10 MED ORDER — CALCIUM CARBONATE ANTACID 500 MG PO CHEW: 1.0000 | CHEWABLE_TABLET | Freq: Every day | ORAL | Status: DC

## 2015-07-10 MED ORDER — LACTATED RINGERS IV BOLUS (SEPSIS)
1000.0000 mL | Freq: Once | INTRAVENOUS | Status: AC
  Administered 2015-07-10: 1000 mL via INTRAVENOUS

## 2015-07-10 MED ORDER — GI COCKTAIL ~~LOC~~
30.0000 mL | Freq: Once | ORAL | Status: AC
  Administered 2015-07-10: 30 mL via ORAL
  Filled 2015-07-10: qty 30

## 2015-07-10 NOTE — Discharge Instructions (Signed)
Cólico biliar °(Biliary Colic)  °El cólico biliar es un dolor continuo o irregular en la zona superior del abdomen. Generalmente se ubica debajo de la zona derecha de la caja torácica. Aparece cuando los cálculos biliares interfieren con el flujo normal de la bilis que proviene de la vesícula. La bilis es un líquido que interviene en la digestión de las grasas. Se produce en el hígado y se almacena en la vesícula. Al comer, La bilis pasa desde la vesícula, a través del conducto cístico y el conducto biliar común al intestino delgado. Allí se mezcla con la comida parcialmente digerida. Si un cálculo obstruye alguno de esos conductos, se detiene el flujo normal de bilis. Las células del conducto biliar se contraen con fuerza para mover el cálculo. Esto causa el dolor del cólico biliar.  °SÍNTOMAS °· El paciente se queja de dolor en la zona superior del abdomen. El dolor puede ser: °¨ En el centro de la zona superior del abdomen, justo por debajo del esternón. °¨ En la zona superior derecha del abdomen, donde se encuentra la vesícula biliar y el hígado. °¨ Se expande hacia la espalda, hacia el omóplato derecho. °· Náuseas y vómitos °· El dolor comienza generalmente después de comer. °· El cólico biliar aparece como una demanda de bilis por parte del sistema digestivo. La demanda de bilis es mayor luego de ingerir alimentos ricos en grasas. Los síntomas también pueden aparecer en personas que han estado ayunando e ingieren abruptamente una comida abundante. La mayoría de los episodios de cólico biliar mejoran luego de 1 a 5 horas. Después que se alivia el dolor más intenso, podrá seguir sintiendo un dolor moderado en el abdomen durante un lapso de 24 horas. °DIAGNÓSTICO °Luego de escuchar la descripción de los síntomas, el médico realizará un examen físico. Deberá prestar atención a la zona superior del abdomen. Esta es la zona en la que se encuentra el hígado y la vesícula biliar. El médico podrá observar los cálculos  a través de una ecografía. También le realizaran un escaneo especializado de la vesícula biliar. Le indicarán análisis de sangre, especialmente si tiene fiebre o el dolor persiste. °PREVENCIÓN °El cólico biliar puede evitarse controlando los factores de riesgo que favorecen los cálculos. Algunos de estos factores de riesgo como la herencia, el aumento de la edad y el embarazo son aspectos normales de la vida. La obesidad y una dieta rica en grasas son factores de riesgo que usted puede modificar a través de cambios hacia un estilo de vida saludable. Las mujeres que atraviesan la menopausia y que reciben terapia de reemplazo hormonal (estrógenos) también tienen más riesgo de desarrollar cólicos biliares. °TRATAMIENTO °· Le prescribirán analgésicos. °· Le indicarán una dieta sin grasas. °· Si el primer episodio es intenso, o si los cólicos aparecen nuevamente, generalmente se indica la cirugía para extirpar la vesícula (colecistectomía). Este procedimiento puede realizarse a través de pequeñas incisiones utilizando un instrumento denominado laparoscopio. Muchas veces se requiere una breve estadía en el hospital. Algunas personas reciben el alta el mismo día. Es el tratamiento más ampliamente utilizado en personas que sufren dolor por cálculos biliares. Es efectivo y seguro, no tiene complicaciones en más del 90% de los casos. °· Si la cirugía no puede llevarse a cabo, podrán utilizarse medicamentos para disolver los cálculos. Esta medicación es cara y puede demorar meses o años hasta que tenga efecto. Sólo podrá disolver cálculos pequeños. °· En algunos casos raros, se combinan estos medicamentos con un procedimiento denominado litotricia por ondas   de choque. Este procedimiento utiliza ondas de choque cuidadosamente dirigidas a romper los cálculos. En muchas personas tratadas con este procedimiento, los cálculos vuelven a formarse luego de algunos años. °PRONÓSTICO °Si los cálculos obstruyen el conducto cístico o  conducto biliar común, usted tiene el riesgo de sufrir episodios repetidos de cólicos biliares. También existe un 25% de probabilidades de desarrollar una infección de la vesícula biliar (colecistitisaguda) o alguna otra complicación en los siguientes 10 a 20 años. Si ha sido sometido a una cirugía, prográmela para el momento en que sea conveniente para usted, y para cuando no se encuentre enfermo. °INSTRUCCIONES PARA EL CUIDADO DOMICILIARIO °· Beba gran cantidad de líquidos claros. °· Evite las comidas con mucha grasa o fritas, o cualquier alimento que empeore su dolor. °· Tome los medicamentos como se le indicó. °SOLICITE ATENCIÓN MÉDICA SI: °· Le sube la temperatura a más de 100.5° F (38.1° C). °· El dolor empeora con el tiempo. °· Siente náuseas y esto le impide comer y beber. °· Tiene vómitos. °SOLICITE ATENCIÓN MÉDICA DE INMEDIATO SI: °· Siente dolor continuo e intenso en el abdomen, que no se alivia con medicamentos. °· Siente náuseas y vómitos que no mejoran con medicamentos. °· Tiene síntomas de cólico biliar y comienza a tener fiebre y escalofríos. Esto puede ser un indicio de que ha desarrollado colecistitis. Comuníquese con su médico inmediatamente. °· Su piel o la parte blanca del ojo se vuelven amarillas (ictericia). °Document Released: 02/04/2008 Document Revised: 01/20/2012 °Exit[employer]® Patient Information ©2015 [employer]. This information is not intended to replace advice given to you by your health care provider. Make sure you discuss any questions you have with your health care provider. ° °

## 2015-07-10 NOTE — Progress Notes (Signed)
Called to notify of pt arrival in MAU and complaint of chest pain. Provider notified of EKG per protocol. Will come see patient

## 2015-07-10 NOTE — MAU Provider Note (Signed)
History   CSN: [id number]  Arrival date and time: 07/10/15 0157   None     Chief Complaint  Patient presents with  . Chest Pain    HPI  Patient is 34 y.o. [id number] [redacted]w[redacted]d here with complaints of epigastric pain. Patient states that the pain woke her up out of sleep around 1am this morning. Ever  Since then she has had a constant sharp pain that radiates to her back. Denies radiation to chest, jaw, or left arm. She endorses some vision changes with the pain as well. Denies any history of reflux. Denies any burning quality to pain. Patient states she alst ate around 4:30p yesterday evening.   Of note, patient states that over the last week she has been having reflux symptoms such as acid in throat and bad taste in mouth. She denies any of those symptoms tonight.   No pregnancy concerns. +FM, denies LOF, VB, contractions, vaginal discharge.   OB History    Gravida Para Term Preterm AB TAB SAB Ectopic Multiple Living   0 0 0 0 0 0 2    -HD for prenatal care   Past Medical History  Diagnosis Date  . Medical history non-contributory     Past Surgical History  Procedure Laterality Date  . Cesarean section      History reviewed. No pertinent family history.  Social History  Substance Use Topics  . Smoking status: Former Smoker    Quit date: 11/21/2012  . Smokeless tobacco: None  . Alcohol Use: No    Allergies: No Known Allergies  Prescriptions prior to admission  Medication Sig Dispense Refill Last Dose  . cephALEXin (KEFLEX) 500 MG capsule Take 1 capsule (500 mg total) by mouth 4 (four) times daily. X 10days 40 capsule 0 06/12/2015 at Unknown time  . Prenatal Vit-Fe Fumarate-FA (PRENATAL MULTIVITAMIN) TABS tablet Take 1 tablet by mouth daily at 12 noon.   06/12/2015 at Unknown time    Review of Systems  Constitutional: Negative for fever and weight loss.  Eyes: Positive for blurred vision.  Respiratory: Positive for shortness of breath.   Cardiovascular: Positive  for chest pain.  Gastrointestinal: Positive for nausea, vomiting and abdominal pain. Negative for diarrhea and constipation.  Neurological: Negative for headaches.  Also per HPI  Physical Exam  Blood pressure 92/47, pulse 58, temperature 98 F (36.7 C), temperature source Oral, resp. rate 18, last menstrual period 01/12/2015.  Physical Exam  Constitutional: She is oriented to person, place, and time. She appears well-developed and well-nourished. She appears distressed.  Cardiovascular: Regular rhythm and normal heart sounds.  Bradycardia present.   Respiratory: Breath sounds normal. Tachypnea noted.  GI: Soft. Normal appearance. There is tenderness in the right upper quadrant and epigastric area.  Musculoskeletal: Normal range of motion. She exhibits no edema or tenderness.  Neurological: She is alert and oriented to person, place, and time.  Skin: Skin is warm and dry.   Results for orders placed or performed during the hospital encounter of 07/10/15 (from the past 24 hour(s))  Troponin I     Status: None   Collection Time: 07/10/15  2:40 AM  Result Value Ref Range   Troponin I <0.03 <0.031 ng/mL  CBC     Status: Abnormal   Collection Time: 07/10/15  2:40 AM  Result Value Ref Range   WBC 10.5 4.0 - 10.5 K/uL   RBC 4.22 3.87 - 5.11 MIL/uL   Hemoglobin 10.9 (L) 12.0 - 15.0 g/dL  HCT 33.3 (L) 36.0 - 46.0 %   MCV 78.9 78.0 - 100.0 fL   MCH 25.8 (L) 26.0 - 34.0 pg   MCHC 32.7 30.0 - 36.0 g/dL   RDW 16.1 09.6 - 04.5 %   Platelets 265 150 - 400 K/uL  Comprehensive metabolic panel     Status: Abnormal   Collection Time: 07/10/15  2:40 AM  Result Value Ref Range   Sodium 137 135 - 145 mmol/L   Potassium 3.3 (L) 3.5 - 5.1 mmol/L   Chloride 102 101 - 111 mmol/L   CO2 25 22 - 32 mmol/L   Glucose, Bld 109 (H) 65 - 99 mg/dL   BUN 11 6 - 20 mg/dL   Creatinine, Ser 4.09 0.44 - 1.00 mg/dL   Calcium 9.0 8.9 - 81.1 mg/dL   Total Protein 6.4 (L) 6.5 - 8.1 g/dL   Albumin 2.9 (L) 3.5 -  5.0 g/dL   AST 31 15 - 41 U/L   ALT 35 14 - 54 U/L   Alkaline Phosphatase 90 38 - 126 U/L   Total Bilirubin 0.6 0.3 - 1.2 mg/dL   GFR calc non Af Amer >60 >60 mL/min   GFR calc Af Amer >60 >60 mL/min   Anion gap 10 5 - 15  Lipase, blood     Status: None   Collection Time: 07/10/15  2:40 AM  Result Value Ref Range   Lipase 22 22 - 51 U/L    MAU Course  Procedures - None  MDM: EKG with sinus bradycardia but otherwise unremarkable Troponin negative Lipase normal CBC, CMP - unremarkable  GI cocktail without relief Morphine x1  LR bolus  Assessment and Plan  A: Patient is 34 y.o. [id number] [redacted]w[redacted]d reporting epigastric pain likely secondary to biliary colic vs reflux. Unsure etiology at this time but can rule out pancreatitis and ACS. Patient improved after treatments.  P: Discharge home pt stable - Reviewed findings and my conclusion - outpatient gallbladder US ordered - Rx for pain medicine and antiemetic given -TUMS for reflux symtpoms - Handout given - Follow-up with OB provider  Caryl Ada, DO 07/10/2015, 2:29 AM PGY-2, [facility]  I was consulted RE: the exam and agree with above. Pt left before CNM arrived.  Reviewed FHR tracing--140's reactive. No UC's. EKG sinus bradycardia w/ rate of 57 bmp  [place], [place] 07/10/2015 8:51 AM

## 2015-07-10 NOTE — MAU Note (Signed)
Pt presents complaining of pain in her chest that started 15 minutes ago. States it feels like acid reflux. Denies abdominal pain, vaginal bleeding or discharge. Reports good fetal movement.

## 2015-07-11 ENCOUNTER — Encounter: Payer: Self-pay | Admitting: *Deleted

## 2015-07-20 ENCOUNTER — Inpatient Hospital Stay (HOSPITAL_COMMUNITY): Admission: RE | Admit: 2015-07-20 | Payer: Self-pay | Source: Ambulatory Visit

## 2015-07-20 ENCOUNTER — Encounter (HOSPITAL_COMMUNITY): Payer: Self-pay | Admitting: Obstetrics & Gynecology

## 2015-07-20 ENCOUNTER — Ambulatory Visit (HOSPITAL_COMMUNITY): Admission: RE | Admit: 2015-07-20 | Payer: Self-pay | Source: Ambulatory Visit

## 2015-08-01 ENCOUNTER — Other Ambulatory Visit (HOSPITAL_COMMUNITY): Payer: Self-pay | Admitting: Maternal and Fetal Medicine

## 2015-08-01 ENCOUNTER — Ambulatory Visit (HOSPITAL_COMMUNITY)
Admission: RE | Admit: 2015-08-01 | Discharge: 2015-08-01 | Disposition: A | Discharge status: Home / Self Care | Payer: Self-pay | Source: Ambulatory Visit | Attending: Maternal and Fetal Medicine | Admitting: Maternal and Fetal Medicine

## 2015-08-01 ENCOUNTER — Ambulatory Visit (HOSPITAL_COMMUNITY)
Admission: RE | Admit: 2015-08-01 | Discharge: 2015-08-01 | Disposition: A | Discharge status: Home / Self Care | Payer: Self-pay | Source: Ambulatory Visit | Attending: Physician Assistant | Admitting: Physician Assistant

## 2015-08-01 ENCOUNTER — Encounter (HOSPITAL_COMMUNITY): Payer: Self-pay

## 2015-08-01 DIAGNOSIS — O34219 Maternal care for unspecified type scar from previous cesarean delivery: Secondary | ICD-10-CM

## 2015-08-01 DIAGNOSIS — O09523 Supervision of elderly multigravida, third trimester: Secondary | ICD-10-CM | POA: Insufficient documentation

## 2015-08-01 DIAGNOSIS — IMO0002 Reserved for concepts with insufficient information to code with codable children: Secondary | ICD-10-CM

## 2015-08-01 DIAGNOSIS — Z3A28 28 weeks gestation of pregnancy: Secondary | ICD-10-CM | POA: Insufficient documentation

## 2015-08-01 DIAGNOSIS — O3421 Maternal care for scar from previous cesarean delivery: Secondary | ICD-10-CM | POA: Insufficient documentation

## 2015-08-02 ENCOUNTER — Encounter: Payer: Self-pay | Admitting: Obstetrics and Gynecology

## 2015-08-02 ENCOUNTER — Ambulatory Visit (INDEPENDENT_AMBULATORY_CARE_PROVIDER_SITE_OTHER): Payer: Self-pay | Admitting: Obstetrics and Gynecology

## 2015-08-02 VITALS — BP 121/71 | HR 89 | Temp 98.3°F | Wt 179.5 lb

## 2015-08-02 DIAGNOSIS — L209 Atopic dermatitis, unspecified: Secondary | ICD-10-CM

## 2015-08-02 DIAGNOSIS — IMO0002 Reserved for concepts with insufficient information to code with codable children: Secondary | ICD-10-CM

## 2015-08-02 DIAGNOSIS — O0933 Supervision of pregnancy with insufficient antenatal care, third trimester: Secondary | ICD-10-CM

## 2015-08-02 DIAGNOSIS — Z98891 History of uterine scar from previous surgery: Secondary | ICD-10-CM

## 2015-08-02 DIAGNOSIS — N10 Acute tubulo-interstitial nephritis: Secondary | ICD-10-CM

## 2015-08-02 DIAGNOSIS — Z23 Encounter for immunization: Secondary | ICD-10-CM

## 2015-08-02 DIAGNOSIS — Z3493 Encounter for supervision of normal pregnancy, unspecified, third trimester: Secondary | ICD-10-CM

## 2015-08-02 DIAGNOSIS — Q249 Congenital malformation of heart, unspecified: Secondary | ICD-10-CM

## 2015-08-02 DIAGNOSIS — O3421 Maternal care for scar from previous cesarean delivery: Secondary | ICD-10-CM

## 2015-08-02 DIAGNOSIS — O0990 Supervision of high risk pregnancy, unspecified, unspecified trimester: Secondary | ICD-10-CM

## 2015-08-02 LAB — POCT URINALYSIS DIP (DEVICE)
Bilirubin Urine: NEGATIVE
Glucose, UA: 100 mg/dL — AB
Leukocytes, UA: NEGATIVE
Nitrite: NEGATIVE
PH: 6.5 (ref 5.0–8.0)
PROTEIN: NEGATIVE mg/dL
SPECIFIC GRAVITY, URINE: 1.025 (ref 1.005–1.030)
UROBILINOGEN UA: 1 mg/dL (ref 0.0–1.0)

## 2015-08-02 LAB — CBC
HEMATOCRIT: 34.7 % — AB (ref 36.0–46.0)
HEMOGLOBIN: 11.5 g/dL — AB (ref 12.0–15.0)
MCH: 25.9 pg — AB (ref 26.0–34.0)
MCHC: 33.1 g/dL (ref 30.0–36.0)
MCV: 78.2 fL (ref 78.0–100.0)
MPV: 9.9 fL (ref 8.6–12.4)
Platelets: 347 10*3/uL (ref 150–400)
RBC: 4.44 MIL/uL (ref 3.87–5.11)
RDW: 14 % (ref 11.5–15.5)
WBC: 10.1 10*3/uL (ref 4.0–10.5)

## 2015-08-02 MED ORDER — TRIAMCINOLONE ACETONIDE 0.025 % EX OINT: 1.0000 "application " | TOPICAL_OINTMENT | Freq: Two times a day (BID) | CUTANEOUS | Status: DC

## 2015-08-02 MED ORDER — TETANUS-DIPHTH-ACELL PERTUSSIS 5-2.5-18.5 LF-MCG/0.5 IM SUSP
0.5000 mL | Freq: Once | INTRAMUSCULAR | Status: AC
  Administered 2015-08-02: 0.5 mL via INTRAMUSCULAR

## 2015-08-02 NOTE — Progress Notes (Signed)
Transfer from [facility] Flu/Tdap vaccine given today Spanish Interpreter present for encounter: Madeline Stark 28 wk labs with 1 hour gtt 28 wk educational material given

## 2015-08-02 NOTE — Progress Notes (Signed)
Subjective:  Madeline Stark is a 34 y.o. G3P2002 at [redacted]w[redacted]d being seen today for initial prenatal care.  Patient reports no complaints.  Contractions: Not present.  Vag. Bleeding: None. Movement: Present. Denies leaking of fluid.   The following portions of the patient's history were reviewed and updated as appropriate: allergies, current medications, past family history, past medical history, past social history, past surgical history and problem list.   Objective:   Filed Vitals:   08/02/15 0844  BP: 121/71  Pulse: 89  Temp: 98.3 F (36.8 C)  Weight: 179 lb 8 oz (81.421 kg)    Fetal Status: Fetal Heart Rate (bpm): 162   Movement: Present     General:  Alert, oriented and cooperative. Patient is in no acute distress.  Skin: Skin is warm and dry. Hyperpigmented flaking rash left axila and left calf  Cardiovascular: Normal heart rate noted  Respiratory: Normal respiratory effort, no problems with respiration noted  Abdomen: Soft, gravid, appropriate for gestational age. Pain/Pressure: Absent     Pelvic: Vag. Bleeding: None     Cervical exam deferred        Extremities: Normal range of motion.  Edema: None  Mental Status: Normal mood and affect. Normal behavior. Normal judgment and thought content.   Urinalysis: Urine Protein: Negative Urine Glucose: Negative  Assessment and Plan:  Pregnancy: G3P2002 at [redacted]w[redacted]d  1. Flu vaccine need - Flu Vaccine QUAD 36+ mos IM; Standing - Flu Vaccine QUAD 36+ mos IM  2. Need for Tdap vaccination - Tdap (BOOSTRIX) injection 0.5 mL; Inject 0.5 mLs into the muscle once. - Culture, OB Urine  3. Prenatal care in third trimester - Glucose Tolerance, 1 HR (50g) w/o Fasting - CBC - RPR - HIV antibody (with reflex) - Culture, OB Urine - tolac and btl papers signed, aware no induction given hx c/s x2  # Fetal cardiac defects - referral to [facility] - continue antenatal surveillance with MFM - plan to deliver at [facility]  # Atopic dermatitis -  triamcinolone  Preterm labor symptoms and general obstetric precautions including but not limited to vaginal bleeding, contractions, leaking of fluid and fetal movement were reviewed in detail with the patient. Please refer to After Visit Summary for other counseling recommendations.  No Follow-up on file.   Kathrynn Running, MD

## 2015-08-03 ENCOUNTER — Telehealth: Payer: Self-pay

## 2015-08-03 ENCOUNTER — Telehealth: Payer: Self-pay | Admitting: Obstetrics and Gynecology

## 2015-08-03 DIAGNOSIS — R7309 Other abnormal glucose: Secondary | ICD-10-CM

## 2015-08-03 LAB — GLUCOSE TOLERANCE, 1 HOUR (50G) W/O FASTING: Glucose, 1 Hour GTT: 136 mg/dL (ref 70–140)

## 2015-08-03 LAB — HIV ANTIBODY (ROUTINE TESTING W REFLEX): HIV 1&2 Ab, 4th Generation: NONREACTIVE

## 2015-08-03 NOTE — Progress Notes (Signed)
Genetic Counseling  High-Risk Gestation Note  Appointment Date:  08/03/2015 Referred By: Willene Hatchet, PA-C Date of Birth:  11-20-1980   Pregnancy History: [id number] Estimated Date of Delivery: 10/19/15 Estimated Gestational Age: 84w5dAttending: JJolyn Lent MD   I met with Madeline Stark for genetic counseling because of ultrasound finding of fetal heart defect. She was accompanied by her friend to today's visit. [employer] Genetic Counseling Intern, CElouise Munroe assisted with genetic counseling under my direct supervision. [employer] English/Spanish interpreter, ABelle [place] provided interpretation for today's visit.   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 Follow-up ultrasound was performed today. Remaining visualized fetal anatomy appeared normal.   Congenital heart defects occur in approximately 1% of pregnancies. Congenital heart defects may occur due to multifactorial influences, chromosomal abnormalities, genetic syndromes or environmental exposures.  Atrioventricular septal defect (also referred to as AV canal defect or endocardial cushion defect) describes a central defect of the heart involving the atrial septum, ventricular septum, and atrioventricular valves. Hypoplastic left heart syndrome (HLHS) describes a group of related heart defects which involve the  underdevelopment of the left ventricle (lower left chamber of the heart), mitral valve, aorta, and aortic valve of the heart. Typically in the heart, the right side of the heart receives blood low in oxygen from the body and supplies blood to the lungs, and the left side of the heart receives oxygenated blood from the lungs and supplies blood to the rest of the body. In hypoplastic left heart syndrome, the left side of the heart cannot effectively pump blood to the lungs, and the right side of the heart supplies blood both to the lungs and the rest of the body through the patent ductus arteriosus and/or patent foramen ovale (openings between the left and right side of the heart that typically close a few days after birth). Once the ductus arteriosus and foramen ovale close, the right side of the heart is unable to pump blood to the rest of the body.   Hypoplastic left heart syndrome is seen in approximately 0.1 to 0.3 per 1000 live births. When HLHS is identified on ultrasound, there is an increased chance for additional heart defects to be present. An increased risk for additional birth defects (non-cardiac) has been observed and estimated to be approximately 10%. HLHS can be seen as an isolated occurrence or can be seen as one feature of an underlying chromosome condition or genetic syndrome. In cases of HLHS, various sources estimate that approximately 2-16% may be associated with an underlying chromosome abnormality, with Turner syndrome and Trisomy 18 being the most common associated conditions. 22q11 deletion syndrome has also been associated with hypoplastic left heart less frequently. We discussed that an AV canal defect is associated with an increased risk for fetal aneuploidy of approximately 50-60%. Specifically, we discussed that AV canal defect is associated with a significant increased risk for Trisomy 21, which is present in approximately 40% of cases of AV canal defect.   We reviewed chromosome,  genes, nondisjunction  and examples and prognoses of various chromosome conditions including Down syndrome (trisomy 26), trisomy 37, trisomy 65, and monosomy X. Additionally, congenital heart defects can be seen as part of an underlying microdeletion/microduplication syndrome or single gene condition. Congenital heart defects can also be due to teratogenic exposures in pregnancy. Madeline Stark denied exposure to environmental toxins or chemical agents. She denied the use of alcohol, tobacco or street drugs. She denied significant viral illnesses during the course of her pregnancy. We reviewed that the overall prognosis is dependent on the severity of the heart defect and the underlying etiology.  Risk for recurrence depends on etiology.  In isolated cases, multifactorial (combination of hereditary and environmental factors) inheritance has been suggested, with approximately 2% risk of recurrence for full siblings (first degree relatives) of an affected individual. However, autosomal recessive inheritance of hypoplastic left heart has also been suggested, in which case, recurrence risk for each future pregnancy together would be 25%.  We discussed available screening and testing options regarding chromosome conditions in the pregnancy. Madeline Stark previously had first trimester screening, which was within normal limits for the conditions screened. We discussed the additional screening option of noninvasive prenatal screening (NIPS)/cell free DNA testing. We discussed the conditions for which it screens, the detection rates, and false positive rates of each. She understands that while highly sensitive and specific, NIPS is not diagnostic. We also discussed the diagnostic testing option of amniocentesis, including the risks, benefits, and limitations. We discussed the associated 1 in 131-438 risk for complications, including spontaneous preterm labor and delivery. We reviewed the option of karyotype  and chromosome microarray analysis from amniocentesis. After careful consideration, Madeline Stark elected to pursue NIPS (Panorama) at the time of today's visit and declined amniocentesis. She stated that she would plan to further talk with the father of the pregnancy regarding the option of amniocentesis.   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 Surgical correction of hypoplastic left heart syndrome may involve staged reconstructive surgeries and/or heart transplantation. Survival rate following surgery and/or heart transplantation varies with each center and with the timing of the procedure. We discussed that specifics regarding treatment can better be addressed after obtaining a fetal echocardiogram and meeting with the pediatric cardiologist. Madeline Stark plans to transfer her OB care and plans for delivery with [facility].   Both family histories were reviewed and found to be contributory for a medical issue for the patient's 71 year-old daughter (from a previous partner). She reportedly has a low white blood cell count and is going to require surgical removal of her spleen. She currently resides in [place]. The patient reported that additional relatives on her daughter's paternal family history also have this same condition. She did not have information regarding the specific name of the condition. We discussed that blood disorders can be due to sporadic, genetic, environmental, or multifactorial causes. Additional information is needed to accurately assess recurrence risk for the current pregnancy (a half-sibling) and additional relatives. Without further information regarding the provided family history, an accurate genetic risk cannot be calculated. Further genetic counseling is warranted if more information is obtained.                  Madeline Stark denied exposure to environmental toxins or chemical agents. She denied the use  of alcohol, tobacco or street drugs. She denied significant viral illnesses during the course of her pregnancy. Her medical and surgical histories were noncontributory.  I counseled Madeline Stark regarding the above risks and available options.  The approximate face-to-face time with the genetic counselor was 40 minutes.  Chipper Oman, MS Certified Genetic Counselor 08/03/2015

## 2015-08-03 NOTE — Telephone Encounter (Signed)
Called pt with Spanish interpreter [employer], and informed pt of abnormal 1 hr glucola and the need for a 3 hr.  Pt stated that she will be able to come in on Monday September 26th @ 0800.  I advised pt on having nothing by mouth starting midnight on Sunday and that she will need to remain at the Clinics the duration of test.  Pt stated understanding.

## 2015-08-03 NOTE — Telephone Encounter (Signed)
1-hour gtt 136, scheduling to return for 3-hour.

## 2015-08-04 ENCOUNTER — Encounter: Payer: Self-pay | Admitting: *Deleted

## 2015-08-04 LAB — RPR

## 2015-08-07 ENCOUNTER — Other Ambulatory Visit: Payer: Self-pay

## 2015-08-07 DIAGNOSIS — R7309 Other abnormal glucose: Secondary | ICD-10-CM

## 2015-08-08 LAB — GLUCOSE TOLERANCE, 3 HOURS
GLUCOSE, 1 HOUR-GESTATIONAL: 182 mg/dL (ref 70–189)
Glucose Tolerance, 2 hour: 124 mg/dL (ref 70–164)
Glucose Tolerance, Fasting: 85 mg/dL (ref 65–99)
Glucose, GTT - 3 Hour: 104 mg/dL (ref 70–144)

## 2015-08-10 ENCOUNTER — Inpatient Hospital Stay (HOSPITAL_COMMUNITY)
Admission: AD | Admit: 2015-08-10 | Discharge: 2015-08-10 | Disposition: A | Discharge status: Home / Self Care | Payer: Self-pay | Source: Ambulatory Visit | Attending: Obstetrics and Gynecology | Admitting: Obstetrics and Gynecology

## 2015-08-10 ENCOUNTER — Encounter (HOSPITAL_COMMUNITY): Payer: Self-pay | Admitting: *Deleted

## 2015-08-10 ENCOUNTER — Inpatient Hospital Stay (HOSPITAL_COMMUNITY): Payer: Self-pay

## 2015-08-10 DIAGNOSIS — Z87891 Personal history of nicotine dependence: Secondary | ICD-10-CM | POA: Insufficient documentation

## 2015-08-10 DIAGNOSIS — R109 Unspecified abdominal pain: Secondary | ICD-10-CM

## 2015-08-10 DIAGNOSIS — R1010 Upper abdominal pain, unspecified: Secondary | ICD-10-CM | POA: Insufficient documentation

## 2015-08-10 DIAGNOSIS — O99613 Diseases of the digestive system complicating pregnancy, third trimester: Secondary | ICD-10-CM | POA: Insufficient documentation

## 2015-08-10 DIAGNOSIS — K8020 Calculus of gallbladder without cholecystitis without obstruction: Secondary | ICD-10-CM | POA: Insufficient documentation

## 2015-08-10 DIAGNOSIS — Z3A30 30 weeks gestation of pregnancy: Secondary | ICD-10-CM | POA: Insufficient documentation

## 2015-08-10 LAB — URINALYSIS, ROUTINE W REFLEX MICROSCOPIC
BILIRUBIN URINE: NEGATIVE
GLUCOSE, UA: NEGATIVE mg/dL
HGB URINE DIPSTICK: NEGATIVE
KETONES UR: NEGATIVE mg/dL
Leukocytes, UA: NEGATIVE
Nitrite: NEGATIVE
PROTEIN: 30 mg/dL — AB
Specific Gravity, Urine: >1.03 — ABNORMAL HIGH (ref 1.005–1.030)
UROBILINOGEN UA: 1 mg/dL (ref 0.0–1.0)
pH: 6 (ref 5.0–8.0)

## 2015-08-10 LAB — COMPREHENSIVE METABOLIC PANEL
ALK PHOS: 113 U/L (ref 38–126)
ALT: 31 U/L (ref 14–54)
AST: 31 U/L (ref 15–41)
Albumin: 2.8 g/dL — ABNORMAL LOW (ref 3.5–5.0)
Anion gap: 6 (ref 5–15)
BUN: 9 mg/dL (ref 6–20)
CALCIUM: 8.3 mg/dL — AB (ref 8.9–10.3)
CO2: 23 mmol/L (ref 22–32)
CREATININE: 0.51 mg/dL (ref 0.44–1.00)
Chloride: 104 mmol/L (ref 101–111)
Glucose, Bld: 101 mg/dL — ABNORMAL HIGH (ref 65–99)
Potassium: 3.5 mmol/L (ref 3.5–5.1)
Sodium: 133 mmol/L — ABNORMAL LOW (ref 135–145)
Total Bilirubin: 0.6 mg/dL (ref 0.3–1.2)
Total Protein: 6.6 g/dL (ref 6.5–8.1)

## 2015-08-10 LAB — URINE MICROSCOPIC-ADD ON

## 2015-08-10 LAB — LIPASE, BLOOD: LIPASE: 23 U/L (ref 22–51)

## 2015-08-10 LAB — AMYLASE: AMYLASE: 68 U/L (ref 28–100)

## 2015-08-10 MED ORDER — OXYCODONE-ACETAMINOPHEN 5-325 MG PO TABS
2.0000 | ORAL_TABLET | Freq: Once | ORAL | Status: AC
  Administered 2015-08-10: 2 via ORAL
  Filled 2015-08-10: qty 2

## 2015-08-10 MED ORDER — OXYCODONE-ACETAMINOPHEN 5-325 MG PO TABS: 1.0000 | ORAL_TABLET | ORAL | Status: DC | PRN

## 2015-08-10 MED ORDER — GI COCKTAIL ~~LOC~~
30.0000 mL | Freq: Once | ORAL | Status: AC
Start: 2015-08-10 — End: 2015-08-10
  Administered 2015-08-10: 30 mL via ORAL
  Filled 2015-08-10: qty 30

## 2015-08-10 NOTE — Discharge Instructions (Signed)
Dieta para el control del colesterol y las grasas  (Fat and Cholesterol Control Diet) Su dieta tiene un [employer] Celebration de grasa y colesterol en su sangre y rganos. El exceso de [place] y colesterol en la sangre puede afectar su:   Corazn.  Vasos sanguneos (arterias, venas).  Vescula biliar.  Hgado.  Pncreas. CONTROL DE LA GRASA Y EL COLESTEROL CON LA DIETA  Ciertos alimentos pueden subir el colesterol y otros pueden [place]. Es importante que reemplace las grasas malas por otros tipos de [place].  No consuma:   Carnes grasas, como las salchichas y [place].  Margarina en barra y Environmental health practitioner en tubo que tienen "aceites parcialmente hidrogenados" en ellos.  Productos horneados, como galletas dulces y saladas que tienen "aceites parcialmente hidrogenados" en ellos.  Los [employer], como los aceites de coco y de [place].  Consuma los siguientes alimentos:  Cortes de peceto o lomo de carne roja.  Pollo (sin piel).  Pescado.  Ternera.  Carne de [employer].  Mariscos.  Frutas, como manzanas.  Verduras, como el brcoli, papas y zanahorias.  Frijoles, arvejas y lentejas (legumbres).  Cereales, como [place], arroz, cuscs y trigo bulgur.  Pastas (sin salsas de crema). Busque alimentos sin grasas, descremados y bajos en colesterol. Encontrar alimentos con fibra soluble y Public librarian (fitosteroles). Usted debe comer 2 gramos al da de estos alimentos.  IDENTIFIQUE LOS ALIMENTOS BAJOS EN GRASAS Y COLESTEROL   Encuentre los alimentos con fibra soluble y esteroles vegetales (fitosteroles). Debe consumir 2 gramos al da de estos alimentos. Ellos son:  Nils Pyle.  Vegetales.  Granos enteros.  Frijoles y [employer].  Frutos secos y semillas.  Lea las etiquetas del paquete. Busque los alimentos bajos en grasas saturadas, libres en grasas trans, y de bajo [place].  Elija quesos que contengan slo 2 a 3 gramos de grasa saturada  por onza.  Use margarina que sea saludable para el corazn que sea [place] de grasas trans o aceites parcialmente hidrogenados.  Evite comprar productos horneados que contengan aceites parcialmente hidrogenados. En su lugar, compre productos horneados elaborados con cereales integrales (trigo integral o harina de avena integral). Evite los productos horneados en cuya etiqueta diga con "harina" o "harina enriquecida".  Compre sopas en lata que no sean cremosas, con bajo contenido de sal y sin grasas adicionadas. PREPARE SUS ALIMENTOS USTED MISMO   Cocine los alimentos hervidos, horneados, al vapor o asados. No fra los alimentos.  Utilice un spray antiadherente para cocinar.  Use limn o hierbas para condimentar comidas en lugar de usar mantequilla o margarina.  Use yogur descremados, salsas o aderezos para ensaladas con bajo contenido de [place]. BAJO EN GRASAS SATURADAS / SUSTITUTOS BAJOS EN GRASA  Carnes / grasas saturadas (g)  Evite: Bife, veteado (3 oz/85 g) / 11 g  Elija: Bife, magro(3 oz/85 g) / 4 g  Evite: [place]a (3 oz/85 g) / 7 g  Elija: [place], magra (3 oz/85 g) / 5 g  Evite: Jamn (3 oz/85 g) / 6 g  Elija: Jamn, corte magro (3 oz/85 g) / 2,4 g  Evite: Pollo con piel, carne oscura (3 oz/85 g) / 4 g  Elija: Pollo, sin piel, carne oscura (3 oz/85 g) / 2 g  Evite: Pollo con piel, carne blanca (3 oz/85 g) / 2,5 g  Elija: Pollo, sin piel, carne blanca (3 oz/85 g) / 1 g Lcteos / Grasa saturada (g)  Evite: Leche entera (1 taza) / 5 g  Elija: Alexis Goodell  descremada, 2% (1 taza) / 3 g  Elija: Leche descremada, 1% (1 taza) / 1,5 g  Elija: Leche descremada, 1 taza / 0,3 g  Evite: Queso duro (1 oz/28 g) / 6 g  Elija: Queso de [employer] (1 oz/28 g) / 2 a 3 g  Evite: Queso cottage, 4% de grasa (1 taza) / 6,5 g  Elija: Queso cottage bajo en grasa, 1% de grasa (1 taza) / 1,5 g  Evite: Helado (1 taza) / 9 g  Elija: Sorbete (1 taza) / 2,5 g  Elija: Yogur  congelado descremado (1 taza) / 0,3 g  Elija: Barra de frutas congeladas / trace  Evite: Crema batida (1 cucharada) / 3,5 g  Elija: Crema batida no lctea (1 cucharada) / 1 g Condimentos / Grasas Saturadas (g)  Evite: Mayonesa (1 cucharada) / 2 g  Elija: Mayonesa baja en grasa (1 cucharada) / 1 g  Evite: Mantequilla (1 cucharada) / 7 g  Elija: Margarina light extra (1 cucharada) / 1 g  Evite: Aceite de coco (1 cucharada) / 11,8 g  Elija: Aceite de oliva (1 cucharada) / 1,8 g  Elija: Aceite de maz (1 cucharada) / 1,7 g  Elija: Aceite de crtamo (1 cucharada) / 1,2 g  Elija: Aceite de girasol (1 cucharada) / 1,4 g  Elija: Aceite de soja (1 cucharada) / 2,4 g  Elija: Aceite de canola (1 cucharada) / 1 g Document Released: 04/28/2012 Document Revised: 02/22/2013 ExitCare Patient Information 2015 [place], [place]. This information is not intended to replace advice given to you by your health care provider. Make sure you discuss any questions you have with your health care provider.  Colelitiasis (Cholelithiasis) La colelitiasis (tambin llamada clculos en la vescula) es una enfermedad de la vescula. La vescula es un rgano pequeo que ayuda a digerir las [place]. Los sntomas de clculos en la vescula son:  Caleen [place] de vomitar (nuseas).  Devolver la comida (vomitar).  Dolor en el vientre.  Piel amarilla (ictericia)  Dolor sbito. Podr sentir Chief Technology Officer durante algunos minutos o unas horas.  Grant Ruts.  Dolor a Insurance claims handler. CUIDADOS EN EL HOGAR  Tome slo los medicamentos segn le haya indicado el mdico.  Siga una dieta baja en grasas hasta que vuelva a ver al mdico nuevamente. Consumir grasas puede Programmer, multimedia.  Concurra a las consultas de control con el mdico, segn las indicaciones. Los ataques generalmente ocurren repetidas veces. Generalmente se requiere de [employer]. SOLICITE AYUDA DE INMEDIATO SI:   El dolor empeora.  El  dolor no se alivia con los [employer].  Tiene fiebre o sntomas que persisten durante ms de 2-3 das.  Tiene fiebre y los sntomas empeoran repentinamente.  Siente nuseas y vomita. ASEGRESE DE QUE:   Comprende estas instrucciones.  Controlar su afeccin.  Recibir ayuda de inmediato si no mejora o si empeora. Document Released: 01/24/2009 Document Revised: 06/30/2013 [facility] Patient Information 2015 [place], [place]. This information is not intended to replace advice given to you by your health care provider. Make sure you discuss any questions you have with your health care provider.

## 2015-08-10 NOTE — MAU Note (Signed)
Maday interpreter with Dr. Natale Milch when d/c instructions given to patient.

## 2015-08-10 NOTE — MAU Note (Signed)
Faculty to come to MAU and assess patient.

## 2015-08-10 NOTE — MAU Note (Signed)
Pt reports she is having upper abd pain since this pm. Pt denies vaginal bleeding or ROM

## 2015-08-10 NOTE — MAU Provider Note (Signed)
History     CSN: [id number]  Arrival date and time: 08/10/15 4098   First Madeline Stark Initiated Contact with Patient 08/10/15 0127      No chief complaint on file.  HPI  Patient is 34 y.o. [id number] [redacted]w[redacted]d here with complaints of upper abdominal pain.  Pt reports that today she has been experiencing upper abdominal pain, primarily in the mid-epigastric region. Of note, she presented to the MAU one month ago with the same complaint.The pain feels like someone is stabbing her with a knife and radiates to her back. It improves when she leans forward, and is worse when she lays flat. Food does not affect the pain. She was prescribed oxycodone for this pain at her last MAU visit, however this did not alleviate her pain today. She has only experienced this pain intermittently over the past month, and usually for only about 5 minutes at a time before it spontaneously resolves. She denies N/V/C/D. Denies SOB, heart palpitations. No personal or family history of heart disease.  +FM, denies LOF, VB, contractions, vaginal discharge.   Past Medical History  Diagnosis Date  . Medical history non-contributory     Past Surgical History  Procedure Laterality Date  . Cesarean section      Family History  Problem Relation Age of Onset  . Cancer Maternal Grandfather   . Cancer Paternal Grandmother     Social History  Substance Use Topics  . Smoking status: Former Smoker    Quit date: 11/21/2012  . Smokeless tobacco: None  . Alcohol Use: No    Allergies: No Known Allergies  Prescriptions prior to admission  Medication Sig Dispense Refill Last Dose  . oxyCODONE-acetaminophen (ROXICET) 5-325 MG per tablet Take 1 tablet by mouth every 4 (four) hours as needed for severe pain. 20 tablet 0 08/09/2015 at Unknown time  . Prenatal Vit-Fe Fumarate-FA (PRENATAL MULTIVITAMIN) TABS tablet Take 1 tablet by mouth daily at 12 noon.   08/10/2015 at Unknown time  . triamcinolone (KENALOG) 0.025 % ointment Apply  1 application topically 2 (two) times daily. 30 g 1 08/09/2015 at Unknown time  . calcium carbonate (TUMS) 500 MG chewable tablet Chew 1 tablet (200 mg of elemental calcium total) by mouth daily. (Patient not taking: Reported on 08/02/2015) 30 tablet 0 More than a month at Unknown time  . ondansetron (ZOFRAN ODT) 4 MG disintegrating tablet Take 1 tablet (4 mg total) by mouth every 8 (eight) hours as needed for nausea or vomiting. (Patient not taking: Reported on 08/01/2015) 20 tablet 0 More than a month at Unknown time    Review of Systems  Constitutional: Negative for fever and chills.  HENT: Negative for congestion and sore throat.   Respiratory: Negative for cough and shortness of breath.   Cardiovascular: Negative for chest pain, palpitations and leg swelling.  Gastrointestinal: Positive for abdominal pain (mid-epigastric/upper quadrants). Negative for nausea, vomiting, diarrhea and constipation.  Genitourinary: Negative for dysuria.  Musculoskeletal: Positive for back pain.   Physical Exam   Blood pressure 101/52, pulse 85, temperature 97.5 F (36.4 C), resp. rate 20, height 5' (1.524 m), weight 178 lb (80.74 kg), last menstrual period 01/12/2015, SpO2 100 %.  Physical Exam  Nursing note and vitals reviewed. Constitutional: She is oriented to person, place, and time. She appears well-developed and well-nourished. No distress.  HENT:  Head: Normocephalic and atraumatic.  Cardiovascular: Normal rate, regular rhythm and normal heart sounds.   No murmur heard. Respiratory: Effort normal. No respiratory distress.  GI:  Soft. She exhibits no distension. There is tenderness (TTP of mid-epigastric region and RUQ). There is no rebound and no guarding.  Gravid  Musculoskeletal: She exhibits no edema or tenderness.  Neurological: She is alert and oriented to person, place, and time. No cranial nerve deficit.  Skin: Skin is warm and dry.  Psychiatric: She has a normal mood and affect. Her  behavior is normal.    MAU Course  Procedures None  MDM Amylase - WNL Lipase - WNL CMP - unremarkable GI cocktail - no improvement in symptoms UA - few bacteria Abd [place] -  IMPRESSION: 1. Cholelithiasis with extensive sludge. The gallbladder is dilated and could be obstructed, but there are no inflammatory changes suggestive of cholecystitis. 2. Intra and extrahepatic ductal enlargement without visible choledocholithiasis.  Assessment and Plan  A: Patient is 34 y.o. [id number] [redacted]w[redacted]d reporting upper abdominal pain likely secondary to cholelithiasis.  P: Dr. Emelda Fear updated. Will see patient to determine plan of care.   Tarri Abernethy, MD PGY-1 [facility]  08/10/2015, 4:24 AM   Pt interviewed, examined, and she acknowleges that the pain is controlled by current p.o. Analgesics, percocet. She has not been seen by general surgery regarding this problem. She is followed thru the [facility] here though she will deliver at [facility], with appt next week at [facility]. We will send a note to [facility] to have Surgical consultation now, so they can decide if continued monitoring til delivery is considered reasonable

## 2015-08-11 ENCOUNTER — Inpatient Hospital Stay (HOSPITAL_COMMUNITY)
Admission: AD | Admit: 2015-08-11 | Discharge: 2015-08-11 | Disposition: A | Discharge status: Home / Self Care | Payer: Self-pay | Source: Ambulatory Visit | Attending: Family Medicine | Admitting: Family Medicine

## 2015-08-11 ENCOUNTER — Encounter (HOSPITAL_COMMUNITY): Payer: Self-pay | Admitting: *Deleted

## 2015-08-11 DIAGNOSIS — Z87891 Personal history of nicotine dependence: Secondary | ICD-10-CM | POA: Insufficient documentation

## 2015-08-11 DIAGNOSIS — Z3A30 30 weeks gestation of pregnancy: Secondary | ICD-10-CM | POA: Insufficient documentation

## 2015-08-11 DIAGNOSIS — O26613 Liver and biliary tract disorders in pregnancy, third trimester: Secondary | ICD-10-CM

## 2015-08-11 DIAGNOSIS — K8020 Calculus of gallbladder without cholecystitis without obstruction: Secondary | ICD-10-CM | POA: Insufficient documentation

## 2015-08-11 DIAGNOSIS — O212 Late vomiting of pregnancy: Secondary | ICD-10-CM | POA: Insufficient documentation

## 2015-08-11 DIAGNOSIS — R1010 Upper abdominal pain, unspecified: Secondary | ICD-10-CM | POA: Insufficient documentation

## 2015-08-11 DIAGNOSIS — O99613 Diseases of the digestive system complicating pregnancy, third trimester: Secondary | ICD-10-CM | POA: Insufficient documentation

## 2015-08-11 HISTORY — DX: Calculus of gallbladder without cholecystitis without obstruction: K80.20

## 2015-08-11 LAB — COMPREHENSIVE METABOLIC PANEL
ALT: 47 U/L (ref 14–54)
ANION GAP: 8 (ref 5–15)
AST: 40 U/L (ref 15–41)
Albumin: 2.8 g/dL — ABNORMAL LOW (ref 3.5–5.0)
Alkaline Phosphatase: 132 U/L — ABNORMAL HIGH (ref 38–126)
BILIRUBIN TOTAL: 1.1 mg/dL (ref 0.3–1.2)
BUN: 7 mg/dL (ref 6–20)
CO2: 23 mmol/L (ref 22–32)
Calcium: 8.8 mg/dL — ABNORMAL LOW (ref 8.9–10.3)
Chloride: 103 mmol/L (ref 101–111)
Creatinine, Ser: 0.52 mg/dL (ref 0.44–1.00)
Glucose, Bld: 90 mg/dL (ref 65–99)
POTASSIUM: 3.9 mmol/L (ref 3.5–5.1)
Sodium: 134 mmol/L — ABNORMAL LOW (ref 135–145)
TOTAL PROTEIN: 6.4 g/dL — AB (ref 6.5–8.1)

## 2015-08-11 LAB — LIPASE, BLOOD: Lipase: 21 U/L — ABNORMAL LOW (ref 22–51)

## 2015-08-11 LAB — URINALYSIS, ROUTINE W REFLEX MICROSCOPIC
GLUCOSE, UA: NEGATIVE mg/dL
HGB URINE DIPSTICK: NEGATIVE
Ketones, ur: NEGATIVE mg/dL
Leukocytes, UA: NEGATIVE
Nitrite: NEGATIVE
Protein, ur: NEGATIVE mg/dL
SPECIFIC GRAVITY, URINE: 1.02 (ref 1.005–1.030)
Urobilinogen, UA: 2 mg/dL — ABNORMAL HIGH (ref 0.0–1.0)
pH: 6 (ref 5.0–8.0)

## 2015-08-11 LAB — CBC
HEMATOCRIT: 34.3 % — AB (ref 36.0–46.0)
Hemoglobin: 11.1 g/dL — ABNORMAL LOW (ref 12.0–15.0)
MCH: 25.3 pg — AB (ref 26.0–34.0)
MCHC: 32.4 g/dL (ref 30.0–36.0)
MCV: 78.1 fL (ref 78.0–100.0)
Platelets: 285 10*3/uL (ref 150–400)
RBC: 4.39 MIL/uL (ref 3.87–5.11)
RDW: 13.9 % (ref 11.5–15.5)
WBC: 7.5 10*3/uL (ref 4.0–10.5)

## 2015-08-11 LAB — AMYLASE: AMYLASE: 63 U/L (ref 28–100)

## 2015-08-11 MED ORDER — HYDROMORPHONE HCL 2 MG PO TABS: 2.0000 mg | ORAL_TABLET | ORAL | Status: DC | PRN

## 2015-08-11 MED ORDER — LACTATED RINGERS IV SOLN
INTRAVENOUS | Status: DC
  Administered 2015-08-11: 16:00:00 via INTRAVENOUS

## 2015-08-11 MED ORDER — ONDANSETRON HCL 4 MG/2ML IJ SOLN
4.0000 mg | Freq: Once | INTRAMUSCULAR | Status: AC
Start: 2015-08-11 — End: 2015-08-11
  Administered 2015-08-11: 4 mg via INTRAVENOUS
  Filled 2015-08-11: qty 2

## 2015-08-11 MED ORDER — HYDROMORPHONE HCL 1 MG/ML IJ SOLN
1.0000 mg | Freq: Once | INTRAMUSCULAR | Status: AC
  Administered 2015-08-11: 1 mg via INTRAVENOUS
  Filled 2015-08-11: qty 1

## 2015-08-11 MED ORDER — PROMETHAZINE HCL 25 MG PO TABS: 25.0000 mg | ORAL_TABLET | Freq: Four times a day (QID) | ORAL | Status: DC | PRN

## 2015-08-11 NOTE — Progress Notes (Signed)
Lab called for blood draw.

## 2015-08-11 NOTE — MAU Provider Note (Signed)
History     CSN: [id number]  Arrival date and time: 08/11/15 1408   First Provider Initiated Contact with Patient 08/11/15 1542         Chief Complaint  Patient presents with  . Abdominal Pain  . Nausea  . Emesis   HPI  Madeline Stark is a 34 y.o. [id number] at [redacted]w[redacted]d who presents with upper abdominal pain & n/v.  Seen in MAU yesterday & diagnosed with gallstones. Prescribed percocet & sent home for f/u with general surgery.  Worsening pain since yesterday; taking percocet with no improvement.  Nausea & vomiting when she eats. Has not taken anything for nausea.  Denies fever, contractions, vaginal bleeding, or LOF.  Positive fetal movement.   Spanish speaking - interpreter at bedside.   OB History    Gravida Para Term Preterm AB TAB SAB Ectopic Multiple Living   0 0 0 0 0 0 2      Past Medical History  Diagnosis Date  . Medical history non-contributory   . Gallstones     Past Surgical History  Procedure Laterality Date  . Cesarean section      Family History  Problem Relation Age of Onset  . Cancer Maternal Grandfather   . Cancer Paternal Grandmother     Social History  Substance Use Topics  . Smoking status: Former Smoker    Quit date: 11/21/2012  . Smokeless tobacco: None  . Alcohol Use: No    Allergies: No Known Allergies  Prescriptions prior to admission  Medication Sig Dispense Refill Last Dose  . ondansetron (ZOFRAN ODT) 4 MG disintegrating tablet Take 1 tablet (4 mg total) by mouth every 8 (eight) hours as needed for nausea or vomiting. 20 tablet 0 08/11/2015 at 1200  . oxyCODONE-acetaminophen (ROXICET) 5-325 MG tablet Take 1-2 tablets by mouth every 4 (four) hours as needed for severe pain. 30 tablet 0 08/11/2015 at 1200  . Prenatal Vit-Fe Fumarate-FA (PRENATAL MULTIVITAMIN) TABS tablet Take 1 tablet by mouth daily at 12 noon.   08/11/2015 at Unknown time  . triamcinolone (KENALOG) 0.025 % ointment Apply 1 application topically 2 (two) times  daily. 30 g 1 08/10/2015 at Unknown time  . calcium carbonate (TUMS) 500 MG chewable tablet Chew 1 tablet (200 mg of elemental calcium total) by mouth daily. (Patient not taking: Reported on 08/02/2015) 30 tablet 0 More than a month at Unknown time    ROS Physical Exam   Blood pressure 107/68, pulse 102, temperature 98 F (36.7 C), temperature source Oral, resp. rate 18, last menstrual period 01/12/2015.  Physical Exam  Nursing note and vitals reviewed. Constitutional: She is oriented to person, place, and time. She appears well-developed and well-nourished. She appears distressed.  HENT:  Head: Normocephalic and atraumatic.  Eyes: Conjunctivae are normal. Right eye exhibits no discharge. Left eye exhibits no discharge. No scleral icterus.  Neck: Normal range of motion.  Cardiovascular: Normal rate, regular rhythm and normal heart sounds.   No murmur heard. Respiratory: Effort normal and breath sounds normal. No respiratory distress. She has no wheezes.  GI: Soft. There is tenderness in the right upper quadrant and epigastric area.  Neurological: She is alert and oriented to person, place, and time.  Skin: Skin is warm and dry. She is not diaphoretic.  Psychiatric: She has a normal mood and affect. Her behavior is normal. Judgment and thought content normal.   Fetal Tracing:  Baseline: 140 Variability: moderate Accelerations: 10x10 Decelerations: none  Toco: irregular  MAU Course  Procedures Results for orders placed or performed during the hospital encounter of 08/11/15 (from the past 24 hour(s))  Urinalysis, Routine w reflex microscopic (not at [facility])     Status: Abnormal   Collection Time: 08/11/15  2:25 PM  Result Value Ref Range   Color, Urine YELLOW YELLOW   APPearance CLEAR CLEAR   Specific Gravity, Urine 1.020 1.005 - 1.030   pH 6.0 5.0 - 8.0   Glucose, UA NEGATIVE NEGATIVE mg/dL   Hgb urine dipstick NEGATIVE NEGATIVE   Bilirubin Urine SMALL (A) NEGATIVE    Ketones, ur NEGATIVE NEGATIVE mg/dL   Protein, ur NEGATIVE NEGATIVE mg/dL   Urobilinogen, UA 2.0 (H) 0.0 - 1.0 mg/dL   Nitrite NEGATIVE NEGATIVE   Leukocytes, UA NEGATIVE NEGATIVE  CBC     Status: Abnormal   Collection Time: 08/11/15  3:38 PM  Result Value Ref Range   WBC 7.5 4.0 - 10.5 K/uL   RBC 4.39 3.87 - 5.11 MIL/uL   Hemoglobin 11.1 (L) 12.0 - 15.0 g/dL   HCT 69.6 (L) 29.5 - 28.4 %   MCV 78.1 78.0 - 100.0 fL   MCH 25.3 (L) 26.0 - 34.0 pg   MCHC 32.4 30.0 - 36.0 g/dL   RDW 13.2 44.0 - 10.2 %   Platelets 285 150 - 400 K/uL  Comprehensive metabolic panel     Status: Abnormal   Collection Time: 08/11/15  3:38 PM  Result Value Ref Range   Sodium 134 (L) 135 - 145 mmol/L   Potassium 3.9 3.5 - 5.1 mmol/L   Chloride 103 101 - 111 mmol/L   CO2 23 22 - 32 mmol/L   Glucose, Bld 90 65 - 99 mg/dL   BUN 7 6 - 20 mg/dL   Creatinine, Ser 7.25 0.44 - 1.00 mg/dL   Calcium 8.8 (L) 8.9 - 10.3 mg/dL   Total Protein 6.4 (L) 6.5 - 8.1 g/dL   Albumin 2.8 (L) 3.5 - 5.0 g/dL   AST 40 15 - 41 U/L   ALT 47 14 - 54 U/L   Alkaline Phosphatase 132 (H) 38 - 126 U/L   Total Bilirubin 1.1 0.3 - 1.2 mg/dL   GFR calc non Af Amer >60 >60 mL/min   GFR calc Af Amer >60 >60 mL/min   Anion gap 8 5 - 15  Amylase     Status: None   Collection Time: 08/11/15  3:38 PM  Result Value Ref Range   Amylase 63 28 - 100 U/L  Lipase, blood     Status: Abnormal   Collection Time: 08/11/15  3:38 PM  Result Value Ref Range   Lipase 21 (L) 22 - 51 U/L    MDM IV fluids, dilaudid, & zofran.  Pain relief with dilaudid D/w Dr. Shawnie Pons. Discharge home to come back Monday for follow up. Rx for dilaudid  Assessment and Plan  A: 1. Cholelithiasis affecting pregnancy in third trimester, antepartum    P: Discharge home F/u in clinic on Monday Rx dilaudid & phenergan Stop taking percocet  Judeth Horn, NP  08/11/2015, 2:44 PM

## 2015-08-11 NOTE — MAU Note (Signed)
Was seen in MAU two days ago gallstones pain is 9/10.

## 2015-08-11 NOTE — Discharge Instructions (Signed)
Colelitiasis (Cholelithiasis) La colelitiasis (tambin llamada clculos en la vescula) es una enfermedad en la que se forman piedras en la vescula. La vescula es un rgano que almacena la bilis que se forma en el hgado y que ayuda a digerir grasas. Los clculos comienzan como pequeos cristales y lentamente se transforman en piedras. El dolor en la vescula ocurre cuando se producen espasmos y los clculos obstruyen el conducto. El dolor tambin se produce cuando una piedra sale por el conducto.  FACTORES DE RIESGO  Ser mujer.   Tener embarazos mltiples. Algunas veces los mdicos aconsejan extirpar los clculos biliares antes de futuros embarazos.   Ser obeso.  Dietas que incluyan comidas fritas y grasas.   Ser mayor de 60 aos y el aumento de la edad.   El uso prolongado de medicamentos que contengan hormonas femeninas.   Tener diabetes mellitus.   Prdida rpida de peso.   Historia familiar de clculos (herencia).  SNTOMAS  Nuseas.   Vmitos.  Dolor abdominal.   Piel amarilla (ictericia)   Dolor sbito. Puede persistir desde algunos minutos hasta algunas horas.  Fiebre.   Sensibilidad al tacto. En algunos casos, cuando los clculos biliares no se mueven hacia el conducto biliar, las personas no sienten dolor ni presentan sntomas. Estos se denominan clculos "silenciosos".  TRATAMIENTO Los clculos silenciosos no requieren tratamiento. En los casos graves, podr ser necesaria una ciruga de urgencia. Las opciones de tratamiento son:  Ciruga para extirpar la vescula. Es el tratamiento ms frecuente.  Medicamentos. No siempre dan resultado y pueden demorar entre 6 y 12 meses o ms en hacer efecto.  Tratamiento con ondas de choque (litotricia biliar extracorporal). En este tratamiento, una mquina de ultrasonido enva ondas de choque a la vescula para destruir los clculos en pequeos fragmentos que luego podrn pasar a los intestinos o ser disueltas  con medicamentos. INSTRUCCIONES PARA EL CUIDADO EN EL HOGAR   Slo tome medicamentos de venta libre o recetados para calmar el dolor, el malestar o bajar la fiebre, segn las indicaciones de su mdico.   Siga una dieta baja en grasas hasta que su mdico lo vea nuevamente. Las grasas hacen que la vescula se contraiga, lo que puede producir dolor.   Concurra a las consultas de control con su mdico segn las indicaciones. Los ataques casi siempre son recurrentes y generalmente habr que someterse a una ciruga como tratamiento permanente.  SOLICITE ATENCIN MDICA DE INMEDIATO SI:   El dolor aumenta y no puede controlarlo con los medicamentos.   Tiene fiebre o sntomas persistentes durante ms de 2 - 3 das.   Tiene fiebre y los sntomas empeoran repentinamente.   Tiene nuseas o vmitos persistentes.  ASEGRESE DE QUE:   Comprende estas instrucciones.  Controlar su afeccin.  Recibir ayuda de inmediato si no mejora o si empeora. Document Released: 08/14/2006 Document Revised: 06/30/2013 ExitCare Patient Information 2015 [employer]. This information is not intended to replace advice given to you by your health care provider. Make sure you discuss any questions you have with your health care provider.  

## 2015-08-14 ENCOUNTER — Ambulatory Visit (HOSPITAL_COMMUNITY): Payer: Self-pay

## 2015-08-14 ENCOUNTER — Encounter (HOSPITAL_COMMUNITY): Payer: Self-pay

## 2015-08-14 ENCOUNTER — Ambulatory Visit (INDEPENDENT_AMBULATORY_CARE_PROVIDER_SITE_OTHER): Payer: Self-pay | Admitting: Family Medicine

## 2015-08-14 VITALS — BP 112/64 | HR 81 | Temp 98.2°F | Wt 177.1 lb

## 2015-08-14 DIAGNOSIS — Z98891 History of uterine scar from previous surgery: Secondary | ICD-10-CM

## 2015-08-14 DIAGNOSIS — L299 Pruritus, unspecified: Secondary | ICD-10-CM

## 2015-08-14 DIAGNOSIS — O99719 Diseases of the skin and subcutaneous tissue complicating pregnancy, unspecified trimester: Secondary | ICD-10-CM

## 2015-08-14 DIAGNOSIS — Q249 Congenital malformation of heart, unspecified: Secondary | ICD-10-CM

## 2015-08-14 DIAGNOSIS — O26619 Liver and biliary tract disorders in pregnancy, unspecified trimester: Secondary | ICD-10-CM

## 2015-08-14 DIAGNOSIS — O99810 Abnormal glucose complicating pregnancy: Secondary | ICD-10-CM

## 2015-08-14 DIAGNOSIS — K8020 Calculus of gallbladder without cholecystitis without obstruction: Secondary | ICD-10-CM

## 2015-08-14 DIAGNOSIS — O99713 Diseases of the skin and subcutaneous tissue complicating pregnancy, third trimester: Secondary | ICD-10-CM

## 2015-08-14 DIAGNOSIS — IMO0002 Reserved for concepts with insufficient information to code with codable children: Secondary | ICD-10-CM

## 2015-08-14 DIAGNOSIS — O26893 Other specified pregnancy related conditions, third trimester: Secondary | ICD-10-CM

## 2015-08-14 DIAGNOSIS — O0993 Supervision of high risk pregnancy, unspecified, third trimester: Secondary | ICD-10-CM

## 2015-08-14 LAB — POCT URINALYSIS DIP (DEVICE)
BILIRUBIN URINE: NEGATIVE
Glucose, UA: NEGATIVE mg/dL
HGB URINE DIPSTICK: NEGATIVE
KETONES UR: NEGATIVE mg/dL
LEUKOCYTES UA: NEGATIVE
Nitrite: NEGATIVE
PH: 6 (ref 5.0–8.0)
Protein, ur: 30 mg/dL — AB
Specific Gravity, Urine: 1.025 (ref 1.005–1.030)
Urobilinogen, UA: 0.2 mg/dL (ref 0.0–1.0)

## 2015-08-14 MED ORDER — HYDROMORPHONE HCL 2 MG PO TABS: 2.0000 mg | ORAL_TABLET | ORAL | Status: DC | PRN

## 2015-08-14 NOTE — Progress Notes (Signed)
Breastfeeding tip of the week reviewed. 

## 2015-08-14 NOTE — Patient Instructions (Addendum)
Usted necessita cita en la Universidad del [address] Es [facility] hospital en [place] (approximadamente 1 hr de aqui).  Si no le han llamado en una semana. porfavor llame [phone] Benadryl para la coremeson   Tercer trimestre de Psychiatrist (Third Trimester of Pregnancy) El tercer trimestre va desde la semana29 hasta la 42, desde el sptimo hasta el noveno mes, y es la poca en la que el feto crece ms rpidamente. Hacia el final del noveno mes, el feto mide alrededor de 20pulgadas (45cm) de largo y pesa entre 6 y 10 libras (2,700 y 28,500kg).  CAMBIOS EN EL ORGANISMO Su organismo atraviesa por muchos cambios durante el [place], y estos varan de Neomia Dear mujer a Educational psychologist.   Seguir [employer]. Es de esperar que aumente entre 25 y 35libras (11 y 16kg) hacia el final del Psychiatrist.  Podrn aparecer las primeras [employer] caderas, el abdomen y las [place].  Puede tener necesidad de Geographical information systems officer con ms frecuencia porque el feto baja hacia la pelvis y ejerce presin sobre la vejiga.  Debido al Vanetta Mulders podr sentir Anthoney Harada estomacal con frecuencia.  Puede estar estreida, ya que ciertas hormonas enlentecen los movimientos de los msculos que [employer] desechos a travs de los intestinos.  Pueden aparecer hemorroides o abultarse e hincharse las venas (venas varicosas).  Puede sentir dolor plvico debido al [employer] y a que las hormonas del Management consultant las articulaciones entre los huesos de la pelvis. El dolor de espalda puede ser consecuencia de la sobrecarga de los msculos que soportan la [place].  Tal vez haya cambios en el cabello que pueden incluir su engrosamiento, crecimiento rpido y cambios en la textura. Adems, a algunas mujeres se les cae el cabello durante o despus del embarazo, o tienen el cabello seco o fino. Lo ms probable es que el cabello se le normalice despus del nacimiento del beb.  Las [employer] seguirn creciendo y Development worker, community. A veces, puede haber una  secrecin amarilla de las mamas llamada calostro.  El ombligo puede salir hacia afuera.  Puede sentir que le falta el aire debido a que se expande el tero.  Puede notar que el feto "baja" o lo siente ms bajo, en el abdomen.  Puede tener una prdida de secrecin mucosa con sangre. Esto suele ocurrir en el trmino de unos [address] a una semana antes de que comience el [employer] de [place].  El cuello del tero se vuelve delgado y blando (se borra) cerca de la fecha de [place]. QU DEBE ESPERAR EN LOS EXMENES PRENATALES  Le harn exmenes prenatales cada 2semanas hasta la semana36. A partir de ese momento le harn exmenes semanales. Durante una visita prenatal de rutina:  La pesarn para asegurarse de que usted y el feto estn creciendo normalmente.  Le tomarn la presin arterial.  Le medirn el abdomen para controlar el desarrollo del beb.  Se escucharn los latidos cardacos fetales.  Se evaluarn los resultados de los estudios solicitados en visitas anteriores.  Le revisarn el cuello del tero cuando est prxima la fecha de parto para controlar si este se ha borrado. Alrededor de la semana36, el mdico le revisar el cuello del tero. Al mismo tiempo, realizar un anlisis de las secreciones del tejido vaginal. Este examen es para determinar si hay un tipo de bacteria, estreptococo Grupo B. El mdico le explicar esto con ms detalle. El mdico puede preguntarle lo siguiente:  Cmo le gustara que fuera el [place].  Cmo se siente.  Si siente los movimientos del beb.  Si ha tenido sntomas anormales, como prdida de lquido, [place], dolores de cabeza intensos o clicos abdominales.  Si tiene [employer]. Otros exmenes o estudios de deteccin que pueden realizarse durante el tercer trimestre incluyen lo siguiente:  Anlisis de sangre para controlar las concentraciones de hierro (anemia).  Controles fetales para determinar su salud, nivel de [place] y  Designer, jewellery. Si tiene [place] enfermedad o hay problemas durante el embarazo, le harn estudios. FALSO TRABAJO DE PARTO Es posible que sienta contracciones leves e irregulares que finalmente desaparecen. Se llaman contracciones de [address] o falso trabajo de [place]. Las [employer] pueden durar horas, [address] o incluso semanas, antes de que el verdadero trabajo de parto se inicie. Si las contracciones ocurren a intervalos regulares, se intensifican o se hacen dolorosas, lo mejor es que la revise el mdico.  SIGNOS DE TRABAJO DE PARTO   Clicos de tipo menstrual.  Contracciones cada o menos.  Contracciones que comienzan en la parte superior del tero y se extienden hacia abajo, a la zona inferior del abdomen y la espalda.  Sensacin de mayor presin en la pelvis o dolor de espalda.  Una secrecin de mucosidad acuosa o con sangre que sale de la vagina. Si tiene alguno de estos signos antes de la semana37 del Psychiatrist, llame a su mdico de inmediato. Debe concurrir al hospital para que la controlen inmediatamente. INSTRUCCIONES PARA EL CUIDADO EN EL HOGAR   Evite fumar, consumir hierbas, beber alcohol y tomar frmacos que no le hayan recetado. Estas sustancias qumicas afectan la formacin y el desarrollo del beb.  Siga las indicaciones del mdico en relacin con el uso de medicamentos. Durante el embarazo, hay medicamentos que son seguros de tomar y otros que no.  Haga actividad fsica solo en la forma indicada por el mdico. Sentir clicos uterinos es un buen signo para Restaurant manager, fast food actividad fsica.  Contine comiendo alimentos que sanos con regularidad.  Use un sostn que le brinde buen soporte si le [employer].  No se d baos de inmersin en agua caliente, baos turcos ni saunas.  Colquese el cinturn de seguridad cuando conduzca.  No coma carne cruda ni queso sin cocinar; evite el contacto con las bandejas sanitarias de los gatos y la tierra que estos animales  usan. Estos elementos contienen grmenes que pueden causar defectos congnitos en el beb.  Tome las vitaminas prenatales.  Si est estreida, pruebe un laxante suave (si el mdico lo autoriza). Consuma ms alimentos ricos en fibra, como vegetales y frutas frescos y Radiation protection practitioner. Beba gran cantidad de lquido para mantener la orina de tono claro o color amarillo plido.  Dese baos de asiento con agua tibia para Engineer, materials o las molestias causadas por las hemorroides. Use una crema para las hemorroides si el mdico la autoriza.  Si tiene venas varicosas, use medias de descanso. Eleve los pies durante , 3 o 4veces por da. Limite la cantidad de sal en su dieta.  Evite levantar objetos pesados, use zapatos de tacones bajos y [place].  Descanse con las piernas elevadas si tiene calambres o dolor de cintura.  Visite a su dentista si no lo ha Occupational hygienist. Use un cepillo de dientes blando para higienizarse los dientes y psese el hilo dental con suavidad.  Puede seguir [employer], a menos que el mdico le indique lo contrario.  No haga viajes largos excepto que sea absolutamente necesario y  solo con la autorizacin del mdico.  Tome clases prenatales para Financial trader, Education administrator y hacer preguntas sobre el [place] de parto y [place].  Haga un ensayo de la partida al hospital.  Prepare el bolso que llevar al hospital.  Prepare la habitacin del beb.  Concurra a todas las visitas prenatales segn las indicaciones de su mdico. SOLICITE ATENCIN MDICA SI:  No est segura de que est en trabajo de parto o de que ha roto la bolsa de las aguas.  Tiene mareos.  Siente clicos leves, presin en la pelvis o dolor persistente en el abdomen.  Tiene nuseas, vmitos o diarrea persistentes.  Tiene secrecin vaginal con mal olor.  Siente dolor al [employer]. SOLICITE ATENCIN MDICA DE INMEDIATO SI:   Tiene  fiebre.  Tiene una prdida de lquido por la vagina.  Tiene sangrado o pequeas prdidas vaginales.  Siente dolor intenso o clicos en el abdomen.  Sube o baja de peso rpidamente.  Tiene dificultad para respirar y siente dolor de pecho.  Sbitamente se le hinchan mucho el rostro, las [place], los tobillos, los pies o las piernas.  No ha sentido los movimientos del beb durante Georgianne Fick.  Siente un dolor de cabeza intenso que no se alivia con medicamentos.  Hay cambios en la visin. Document Released: 08/07/2005 Document Revised: 11/02/2013 [facility] Patient Information 2015 [place], [place]. This information is not intended to replace advice given to you by your health care provider. Make sure you discuss any questions you have with your health care provider.

## 2015-08-14 NOTE — Progress Notes (Signed)
Subjective:  Madeline Stark is a 34 y.o. G3P2002 at [redacted]w[redacted]d being seen today for ongoing prenatal care.  Patient reports no complaints.  Contractions: Not present.  Vag. Bleeding: None. Movement: Present. Denies leaking of fluid.   Cholelithiasis- reports itching over whole body, esp in last week. Reports no TTP over her RUQ. Denies fever, chills. Reports nausea which is at its baseline. She is eating less greasy foods.   The following portions of the patient's history were reviewed and updated as appropriate: allergies, current medications, past family history, past medical history, past social history, past surgical history and problem list.   Objective:   Filed Vitals:   08/14/15 0958  BP: 112/64  Pulse: 81  Temp: 98.2 F (36.8 C)  Weight: 177 lb 1.6 oz (80.332 kg)    Fetal Status: Fetal Heart Rate (bpm): 147 Fundal Height: 33 cm Movement: Present     General:  Alert, oriented and cooperative. Patient is in no acute distress.  Skin: Skin is warm and dry. No rash noted.   Cardiovascular: Normal heart rate noted  Respiratory: Normal respiratory effort, no problems with respiration noted  Abdomen: Soft, gravid, appropriate for gestational age. Pain/Pressure: Present . No TTP over RUQ  Pelvic: Vag. Bleeding: None     Cervical exam deferred        Extremities: Normal range of motion.  Edema: None  Mental Status: Normal mood and affect. Normal behavior. Normal judgment and thought content.   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 Supervision of high-risk pregnancy, third trimester -Updated pregnancy box -Reviewed with patient need to meet with MFM at [facility] and to help plan delivery at this facility.   3. History of cesarean delivery -History of CSx2 and  desires TOLAC if possible. Discussed with patient that she should discuss this with both her MFM specialist and any neonatal/pediatric cardiology MD she sees.  -Desires BTL  4. Cholelithiasis affecting pregnancy, antepartum - No signs of cholecystitis currently, reviewed warning s/sx. Will need intervention pp for symptomatic cholelithiasis  - HYDROmorphone (DILAUDID) 2 MG tablet; Take 1 tablet (2 mg total) by mouth every 4 (four) hours as needed for severe pain.  Dispense: 30 tablet; Refill: 0 - Bile acids, total  5. Pruritus gravidarum, third trimester - Bile acids, total - Recommend use of benadryl prn - Has history of atopic dermatitis but this is full body itching and need to rule out other cause.  Last bili 1.0 (<1 week ago)   6. Abnormal O'Sullivan glucose challenge test, antepartum -  Early gtt= 51 - Third trimester, 1hr GTT-136 - 3hr  [phone] (passed)  Preterm labor symptoms and general obstetric precautions including but not limited to vaginal bleeding, contractions, leaking of fluid and fetal movement were reviewed in detail with the patient. Please refer to After Visit Summary for other counseling recommendations.  Return in about 1 week (around 08/21/2015) for Routine prenatal care.   Federico Flake, MD

## 2015-08-15 ENCOUNTER — Other Ambulatory Visit (HOSPITAL_COMMUNITY): Payer: Self-pay | Admitting: Obstetrics and Gynecology

## 2015-08-15 ENCOUNTER — Telehealth (HOSPITAL_COMMUNITY): Payer: Self-pay | Admitting: MS"

## 2015-08-15 NOTE — Telephone Encounter (Signed)
Called Madeline Stark to discuss her prenatal cell free DNA test results via Spanish/English telephonic interpreter [phone] from [employer].  Ms. Tynetta Bachmann had Panorama testing through [facility].  Testing was offered because of ultrasound finding of fetal heart defect.   The patient was identified by name and DOB.  We reviewed that these are within normal limits, showing a less than 1 in 10,000 risk for trisomies 21, 18 and 13, and monosomy X (Turner syndrome).  In addition, the risk for triploidy/vanishing twin and sex chromosome trisomies (47,XXX and 47,XXY) was also low risk.  We reviewed that this testing identifies > 99% of pregnancies with trisomy 62, trisomy 109, sex chromosome trisomies (47,XXX and 47,XXY), and triploidy. The detection rate for trisomy 18 is 96%.  The detection rate for monosomy X is ~92%.  The false positive rate is <0.1% for all conditions. Testing was also consistent with female fetal sex.   She understands that this testing does not identify all genetic or chromosome conditions. We discussed that screening for 22q11 deletion syndrome was not performed on this sample but is still available as a screen in the pregnancy. If the patient desires this screening, a second blood sample can be submitted. Patient plans to further consider this option and discuss when she returns for ultrasound on 08/29/15.  All questions were answered to her satisfaction, she was encouraged to call with additional questions or concerns.  Quinn Plowman, MS Patent attorney

## 2015-08-16 LAB — BILE ACIDS, TOTAL: Bile Acids Total: 8 umol/L (ref 0–19)

## 2015-08-18 ENCOUNTER — Encounter: Payer: Self-pay | Admitting: Obstetrics & Gynecology

## 2015-08-23 ENCOUNTER — Ambulatory Visit (INDEPENDENT_AMBULATORY_CARE_PROVIDER_SITE_OTHER): Payer: Self-pay | Admitting: Family

## 2015-08-23 VITALS — BP 120/66 | HR 99 | Temp 98.1°F | Wt 177.0 lb

## 2015-08-23 DIAGNOSIS — K8020 Calculus of gallbladder without cholecystitis without obstruction: Secondary | ICD-10-CM

## 2015-08-23 DIAGNOSIS — O26619 Liver and biliary tract disorders in pregnancy, unspecified trimester: Principal | ICD-10-CM

## 2015-08-23 DIAGNOSIS — O26613 Liver and biliary tract disorders in pregnancy, third trimester: Secondary | ICD-10-CM

## 2015-08-23 LAB — POCT URINALYSIS DIP (DEVICE)
BILIRUBIN URINE: NEGATIVE
GLUCOSE, UA: NEGATIVE mg/dL
Ketones, ur: NEGATIVE mg/dL
LEUKOCYTES UA: NEGATIVE
NITRITE: NEGATIVE
PH: 6.5 (ref 5.0–8.0)
Protein, ur: NEGATIVE mg/dL
Specific Gravity, Urine: 1.02 (ref 1.005–1.030)
Urobilinogen, UA: 0.2 mg/dL (ref 0.0–1.0)

## 2015-08-23 NOTE — Progress Notes (Signed)
Subjective:  Madeline Stark is a 34 y.o. G3P2002 at 5958w6d being seen today for ongoing prenatal care.  Patient reports resolved abdominal pain and generalized itching.  Here due to feeling baby has dropped in past two days.  Contractions: Not present.  Vag. Bleeding: None.  . Denies leaking of fluid.   The following portions of the patient's history were reviewed and updated as appropriate: allergies, current medications, past family history, past medical history, past social history, past surgical history and problem list. Problem list updated.  Objective:   Filed Vitals:   08/23/15 1031  BP: 120/66  Pulse: 99  Temp: 98.1 F (36.7 C)  Weight: 177 lb (80.287 kg)    Fetal Status: Fetal Heart Rate (bpm): 144         General:  Alert, oriented and cooperative. Patient is in no acute distress.  Skin: Skin is warm and dry. No rash noted.   Cardiovascular: Normal heart rate noted  Respiratory: Normal respiratory effort, no problems with respiration noted  Abdomen: Soft, gravid, appropriate for gestational age. Pain/Pressure: Present     Pelvic: Vag. Bleeding: None     Cervical exam deferred        Extremities: Normal range of motion.     Mental Status: Normal mood and affect. Normal behavior. Normal judgment and thought content.   Urinalysis:    Protein negative Glucose negative  Assessment and Plan:  Pregnancy: 1. 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 Rassette checking to see if patient has an appt  2. Supervision of high-risk pregnancy, third trimester  3. History of cesarean delivery  4. Cholelithiasis affecting pregnancy, antepartum - No signs of cholecystitis currently, reviewed warning s/sx. Will need intervention pp for symptomatic cholelithiasis  - Bile acids, total 9 (nml)  5. Pruritus gravidarum, third trimester - Resolved  Preterm labor symptoms and  general obstetric precautions including but not limited to vaginal bleeding, contractions, leaking of fluid and fetal movement were reviewed in detail with the patient. Please refer to After Visit Summary for other counseling recommendations.  Return in about 2 weeks (around 09/06/2015).   Eino FarberWalidah Kennith GainN Karim, CNM

## 2015-08-24 ENCOUNTER — Encounter: Payer: Self-pay | Admitting: Family Medicine

## 2015-08-25 ENCOUNTER — Telehealth: Payer: Self-pay | Admitting: *Deleted

## 2015-08-25 NOTE — Telephone Encounter (Signed)
Madeline Stark from [facility] called the clinic for clarification regarding patient transferring care.  Dr. Ashok PallWouk reviewed chart and states patient is to [facility] and [facility].  Office would prefer [facility] have [facility].  [facility] [phone].

## 2015-08-29 ENCOUNTER — Ambulatory Visit (HOSPITAL_COMMUNITY)
Admission: RE | Admit: 2015-08-29 | Discharge: 2015-08-29 | Disposition: A | Discharge status: Home / Self Care | Payer: Self-pay | Source: Ambulatory Visit | Attending: Physician Assistant | Admitting: Physician Assistant

## 2015-08-29 ENCOUNTER — Encounter (HOSPITAL_COMMUNITY): Payer: Self-pay

## 2015-08-29 DIAGNOSIS — Z3A00 Weeks of gestation of pregnancy not specified: Secondary | ICD-10-CM | POA: Insufficient documentation

## 2015-08-29 DIAGNOSIS — IMO0002 Reserved for concepts with insufficient information to code with codable children: Secondary | ICD-10-CM

## 2015-09-06 ENCOUNTER — Encounter: Payer: Self-pay | Admitting: Family

## 2015-09-06 ENCOUNTER — Telehealth: Payer: Self-pay | Admitting: Obstetrics and Gynecology

## 2015-09-06 NOTE — Telephone Encounter (Signed)
Called patient informed of upcoming appointment scheduled 09/08/15, at [facility]. Also informed patient that she would no longer be seen here in clinic and all future appointments have been cancelled. Explained to her that all [facility]. Provided patient with [facility] address and telephone number, patient understood and had no other questions.

## 2015-09-06 NOTE — Telephone Encounter (Signed)
Called patient confirmed appointment date she

## 2015-09-07 ENCOUNTER — Encounter (HOSPITAL_COMMUNITY): Payer: Self-pay

## 2015-09-07 ENCOUNTER — Encounter: Payer: Self-pay | Admitting: Obstetrics & Gynecology

## 2015-09-07 ENCOUNTER — Inpatient Hospital Stay (HOSPITAL_COMMUNITY)
Admission: AD | Admit: 2015-09-07 | Discharge: 2015-09-07 | Disposition: A | Discharge status: Home / Self Care | Payer: Self-pay | Source: Ambulatory Visit | Attending: Family Medicine | Admitting: Family Medicine

## 2015-09-07 DIAGNOSIS — J060 Acute laryngopharyngitis: Secondary | ICD-10-CM

## 2015-09-07 DIAGNOSIS — O26893 Other specified pregnancy related conditions, third trimester: Secondary | ICD-10-CM | POA: Insufficient documentation

## 2015-09-07 DIAGNOSIS — R05 Cough: Secondary | ICD-10-CM | POA: Insufficient documentation

## 2015-09-07 DIAGNOSIS — J029 Acute pharyngitis, unspecified: Secondary | ICD-10-CM | POA: Insufficient documentation

## 2015-09-07 DIAGNOSIS — Z87891 Personal history of nicotine dependence: Secondary | ICD-10-CM | POA: Insufficient documentation

## 2015-09-07 DIAGNOSIS — R059 Cough, unspecified: Secondary | ICD-10-CM

## 2015-09-07 DIAGNOSIS — Z3A34 34 weeks gestation of pregnancy: Secondary | ICD-10-CM | POA: Insufficient documentation

## 2015-09-07 LAB — INFLUENZA PANEL BY PCR (TYPE A & B)
H1N1 flu by pcr: NOT DETECTED
INFLAPCR: NEGATIVE
Influenza B By PCR: NEGATIVE

## 2015-09-07 LAB — RAPID STREP SCREEN (MED CTR MEBANE ONLY): STREPTOCOCCUS, GROUP A SCREEN (DIRECT): NEGATIVE

## 2015-09-07 MED ORDER — BENZONATATE 100 MG PO CAPS
200.0000 mg | ORAL_CAPSULE | Freq: Once | ORAL | Status: AC
  Administered 2015-09-07: 200 mg via ORAL
  Filled 2015-09-07: qty 2

## 2015-09-07 MED ORDER — ONDANSETRON 4 MG PO TBDP: 4.0000 mg | ORAL_TABLET | Freq: Three times a day (TID) | ORAL | Status: DC | PRN

## 2015-09-07 MED ORDER — HYDROMORPHONE HCL 2 MG PO TABS: 2.0000 mg | ORAL_TABLET | ORAL | Status: DC | PRN

## 2015-09-07 MED ORDER — BENZONATATE 100 MG PO CAPS: 100.0000 mg | ORAL_CAPSULE | Freq: Three times a day (TID) | ORAL | Status: DC

## 2015-09-07 MED ORDER — PROMETHAZINE HCL 25 MG PO TABS: 25.0000 mg | ORAL_TABLET | Freq: Four times a day (QID) | ORAL | Status: DC | PRN

## 2015-09-07 NOTE — Discharge Instructions (Signed)
Tos en los adultos (Cough, Adult) La tos es un reflejo que limpia la garganta y las vas respiratorias, y ayuda a la curacin y Training and development officerla proteccin de los pulmones. Es normal toser de Teacher, English as a foreign languagevez en cuando, pero cuando esta se presenta con otros sntomas o dura mucho tiempo puede ser el signo de una enfermedad que Fair Playnecesita tratamiento. La tos puede durar solo 2 o 3semanas (aguda) o ms de 8semanas (crnica). CAUSAS Comnmente, las causas de la tos son las siguientes:  Visual merchandisernhalar sustancias que Sealed Air Corporationirritan los pulmones.  Una infeccin respiratoria viral o bacteriana.  Alergias.  Asma.  Goteo posnasal.  Fumar.  El retroceso de cido estomacal hacia el esfago (reflujo gastroesofgico).  Algunos medicamentos.  Los problemas pulmonares crnicos, entre ellos, la enfermedad pulmonar obstructiva crnica (EPOC) (o, en contadas ocasiones, el cncer de pulmn).  Otras afecciones, como la insuficiencia cardaca. INSTRUCCIONES PARA EL CUIDADO EN EL HOGAR  Est atento a cualquier cambio en los sntomas. Tome estas medidas para Paramedicaliviar las molestias:  Tome los medicamentos solamente como se lo haya indicado el mdico.  Si le recetaron un antibitico, tmelo como se lo haya indicado el mdico. No deje de tomar los antibiticos aunque comience a sentirse mejor.  Hable con el mdico antes de tomar un antitusivo.  Beba suficiente lquido para Photographermantener la orina clara o de color amarillo plido.  Si el aire est seco, use un vaporizador o un humidificador con vapor fro en su habitacin o en su casa para ayudar a aflojar las secreciones.  Evite todas las cosas que le producen tos en el trabajo o en su casa.  Si la tos aumenta durante la noche, intente dormir semisentado.  Evite el humo del cigarrillo. Si fuma, deje de hacerlo. Si necesita ayuda para dejar de fumar, consulte al mdico.  Evite la cafena.  Evite el alcohol.  Descanse todo lo que sea necesario. SOLICITE ATENCIN MDICA SI:   Aparecen nuevos  sntomas.  Expectora pus al toser.  La tos no mejora despus de 2 o 3semanas, o empeora.  No puede controlar la tos con antitusivos y no puede dormir bien.  Tiene un dolor que se intensifica o que no puede Sales promotion account executivecontrolar con analgsicos.  Tiene fiebre.  Baja de peso sin causa aparente.  Tiene transpiracin nocturna. SOLICITE ATENCIN MDICA DE INMEDIATO SI:  Tose y escupe sangre.  Tiene dificultad para respirar.  Los latidos cardacos son muy rpidos.   Esta informacin no tiene Theme park managercomo fin reemplazar el consejo del mdico. Asegrese de hacerle al mdico cualquier pregunta que tenga.   Document Released: 06/05/2011 Document Revised: 07/19/2015 Elsevier Interactive Patient Education 2016 [employer]    The ServiceMaster CompanyLas medicinas seguras para tomar durante el embarazo  Safe Medications in Pregnancy  Acn:  Benzoyl Peroxide (Perxido de benzolo)  Salicylic Acid (cido saliclico)  Dolor de espalda/Dolor de cabeza:  Tylenol: 2 pastillas de concentracin regular cada 4 horas O 2 pastillas de concentracin fuerte cada 6 horas  Resfriados/Tos/Alergias:  Benadryl (sin alcohol) 25 mg cada 6 horas segn lo necesite Breath Right strips (Tiras para respirar correctamente)  Claritin  Cepacol (pastillas de chupar para la garganta)  Chloraseptic (aerosol para la garganta)  Cold-Eeze- hasta tres veces por da  Cough drops (pastillas de chupar para la tos, sin alcohol)  Flonase (con receta mdica solamente)  Guaifenesin  Mucinex  Robitussin DM (simple solamente, sin alcohol)  Saline nasal spray/drops (Aerosol nasal salino/gotas) Sudafed (pseudoephedrine) y  Actifed * utilizar slo despus de 12 semanas de gestacin y si  no tiene la presin arterial alta.  Tylenol Vicks  VapoRub  Zinc lozenges (pastillas para la garganta)  Zyrtec  Estreimiento:  Colace  Ducolax (supositorios)  Fleet enema (lavado intestinal rectal)  Glycerin (supositorios)  Metamucil  Milk of magnesia (leche de  magnesia)  Miralax  Senokot  Smooth Move (t)  Diarrea:  Kaopectate Imodium A-D  *NO tome Pepto-Bismol  Hemorroides:  Anusol  Anusol HC  Preparation H  Tucks  Indigestin:  Tums  Maalox  Mylanta  Zantac  Pepcid  Insomnia:  Benadryl (sin alcohol)  cada 6 horas segn lo necesite  Tylenol PM  Unisom, no Gelcaps  Calambres en las piernas:  Tums  MagGel Nuseas/Vmitos:  Bonine  Dramamine  Emetrol  Ginger (extracto)  Sea-Bands  Meclizine  Medicina para las nuseas que puede tomar durante el embarazo: Unisom (doxylamine succinate, pastillas de 25 mg) Tome una pastilla al da al [place]. Si los sntomas no estn adecuadamente controlados, la dosis puede aumentarse hasta una dosis mxima recomendada de [employer] al da (1/2 pastilla por la [place], 1/2 pastilla a media tarde y Neomia Dear pastilla al Ursa). Pastillas de Vitamina B6 de . Tome [employer] veces al da (hasta 200 mg por da).  Erupciones en la piel:  Productos de Aveeno  Benadryl cream (crema o una dosis de  cada 6 horas segn lo necesite)  Calamine Lotion (locin)  1% cortisone cream (crema de cortisona de 1%)  nfeccin vaginal por hongos (candidiasis):  Gyne-lotrimin 7  Monistat 7   **Si est tomando varias medicinas, por favor revise las etiquetas para Art gallery manager los mismos ingredientes [place]. **Tome la medicina segn lo indicado en la etiqueta. **No tome ms de 400 mg de Tylenol en 24 horas. **No tome medicinas que contengan aspirina o ibuprofeno.

## 2015-09-07 NOTE — MAU Note (Signed)
Pt presents to MAU with complaints of a sore throat. Denies any issues with the pregnancy. Denies vaginal bleeding or discharge.

## 2015-09-07 NOTE — MAU Provider Note (Signed)
History     CSN: [id number]  Arrival date and time: 09/07/15 1641   None     Chief Complaint  Patient presents with  . Sore Throat   HPI Manpreet Kemmer is a 34 y.o. G3P2002 at [redacted]w[redacted]d who presents with cough & sore throat.  Symptoms started last night.  No treatment for symptoms.  Rates sore throat as 10/10. States coughing brings up some clear phlegm.  Denies headache, ear pain, or fever.  Denies sick contacts.  Denies abdominal pain, LOF, or vaginal bleeding. Positive fetal movement.  In the process of transferring [facility] to [facility] d/f fetal hypoplastic heart.   OB History    Gravida Para Term Preterm AB TAB SAB Ectopic Multiple Living   0 0 0 0 0 0 2      Past Medical History  Diagnosis Date  . Medical history non-contributory   . Gallstones     Past Surgical History  Procedure Laterality Date  . Cesarean section      Family History  Problem Relation Age of Onset  . Cancer Maternal Grandfather   . Cancer Paternal Grandmother     Social History  Substance Use Topics  . Smoking status: Former Smoker    Quit date: 11/21/2012  . Smokeless tobacco: None  . Alcohol Use: No    Allergies: No Known Allergies  Prescriptions prior to admission  Medication Sig Dispense Refill Last Dose  . HYDROmorphone (DILAUDID) 2 MG tablet Take 1 tablet (2 mg total) by mouth every 4 (four) hours as needed for severe pain. 30 tablet 0 09/07/2015 at Unknown time  . ondansetron (ZOFRAN ODT) 4 MG disintegrating tablet Take 1 tablet (4 mg total) by mouth every 8 (eight) hours as needed for nausea or vomiting. 20 tablet 0 09/07/2015 at Unknown time  . Prenatal Vit-Fe Fumarate-FA (PRENATAL MULTIVITAMIN) TABS tablet Take 1 tablet by mouth daily at 12 noon.   09/07/2015 at Unknown time  . promethazine (PHENERGAN) 25 MG tablet Take 1 tablet (25 mg total) by mouth every 6 (six) hours as needed for nausea or vomiting. 15 tablet 0 09/07/2015 at Unknown  time  . triamcinolone (KENALOG) 0.025 % ointment Apply 1 application topically 2 (two) times daily. 30 g 1 09/07/2015 at Unknown time    Review of Systems  Constitutional: Negative for fever.  HENT: Positive for sore throat. Negative for ear pain.   Respiratory: Positive for cough and sputum production. Negative for hemoptysis, shortness of breath and wheezing.   Cardiovascular: Negative.  Negative for chest pain.  Gastrointestinal: Positive for nausea and abdominal pain (r/t gallstones).  Genitourinary: Negative.   Neurological: Negative for headaches.   Physical Exam   Blood pressure 114/67, pulse 123, temperature 98.2 F (36.8 C), resp. rate 18, last menstrual period 01/12/2015.  Physical Exam  Nursing note and vitals reviewed. Constitutional: She is oriented to person, place, and time. She appears well-developed and well-nourished. No distress.  HENT:  Head: Normocephalic and atraumatic.  Right Ear: External ear normal.  Left Ear: External ear normal.  Mouth/Throat: Uvula is midline. Posterior oropharyngeal erythema present. No oropharyngeal exudate, posterior oropharyngeal edema or tonsillar abscesses.  Eyes: Conjunctivae are normal. Right eye exhibits no discharge. Left eye exhibits no discharge. No scleral icterus.  Neck: Normal range of motion. Neck supple.  Cardiovascular: Normal rate, regular rhythm and normal heart sounds.   No murmur heard. Respiratory: Effort normal and breath sounds normal. No respiratory distress. She has  no wheezes.  GI: Soft.  Lymphadenopathy:    She has no cervical adenopathy.  Neurological: She is alert and oriented to person, place, and time.  Skin: Skin is warm and dry. She is not diaphoretic.  Psychiatric: She has a normal mood and affect. Her behavior is normal. Judgment and thought content normal.   Fetal Tracing:  Baseline: 145 Variability: moderate Accelerations: 15x15 Decelerations: none  Toco: x1, 60 sec    MAU Course   Procedures No results found for this or any previous visit (from the past 24 hour(s)).  MDM Pt afebrile Flu & strep swab collected Tessalon perls in MAU Category 1 tracing  Assessment and Plan  A: 1. Cough   2. Acute pharyngitis, unspecified etiology    P: Discharge home [facility] & strep swab pending Refilled prescriptions until she gets in with new ob/gyn Rx tessalon perls Take zyrtec daily; can use chloraseptic spray for throat.   Judeth HornErin Nikia Levels, NP  09/07/2015, 6:18 PM

## 2015-09-12 LAB — CULTURE, GROUP A STREP

## 2015-11-16 ENCOUNTER — Encounter (HOSPITAL_COMMUNITY): Payer: Self-pay | Admitting: Emergency Medicine

## 2015-11-16 ENCOUNTER — Encounter (HOSPITAL_COMMUNITY): Payer: Self-pay | Admitting: *Deleted

## 2015-11-16 ENCOUNTER — Emergency Department (HOSPITAL_COMMUNITY): Payer: Self-pay

## 2015-11-16 ENCOUNTER — Inpatient Hospital Stay (HOSPITAL_COMMUNITY)
Admission: EM | Admit: 2015-11-16 | Discharge: 2015-11-18 | DRG: 419 | Disposition: A | Discharge status: Home / Self Care | Payer: Self-pay | Attending: General Surgery | Admitting: General Surgery

## 2015-11-16 ENCOUNTER — Emergency Department (HOSPITAL_COMMUNITY)
Admission: EM | Admit: 2015-11-16 | Discharge: 2015-11-16 | Disposition: A | Discharge status: Home / Self Care | Payer: Self-pay | Attending: Emergency Medicine | Admitting: Emergency Medicine

## 2015-11-16 DIAGNOSIS — K8020 Calculus of gallbladder without cholecystitis without obstruction: Secondary | ICD-10-CM

## 2015-11-16 DIAGNOSIS — Z79899 Other long term (current) drug therapy: Secondary | ICD-10-CM | POA: Insufficient documentation

## 2015-11-16 DIAGNOSIS — K8051 Calculus of bile duct without cholangitis or cholecystitis with obstruction: Secondary | ICD-10-CM | POA: Insufficient documentation

## 2015-11-16 DIAGNOSIS — R1011 Right upper quadrant pain: Secondary | ICD-10-CM

## 2015-11-16 DIAGNOSIS — Z87891 Personal history of nicotine dependence: Secondary | ICD-10-CM | POA: Insufficient documentation

## 2015-11-16 DIAGNOSIS — Z7952 Long term (current) use of systemic steroids: Secondary | ICD-10-CM | POA: Insufficient documentation

## 2015-11-16 DIAGNOSIS — Z79891 Long term (current) use of opiate analgesic: Secondary | ICD-10-CM

## 2015-11-16 DIAGNOSIS — K8010 Calculus of gallbladder with chronic cholecystitis without obstruction: Principal | ICD-10-CM | POA: Diagnosis present

## 2015-11-16 LAB — COMPREHENSIVE METABOLIC PANEL
ALT: 51 U/L (ref 14–54)
ALT: 47 U/L (ref 14–54)
ANION GAP: 8 (ref 5–15)
AST: 30 U/L (ref 15–41)
AST: 27 U/L (ref 15–41)
Albumin: 3.7 g/dL (ref 3.5–5.0)
Albumin: 4 g/dL (ref 3.5–5.0)
Alkaline Phosphatase: 99 U/L (ref 38–126)
Alkaline Phosphatase: 107 U/L (ref 38–126)
Anion gap: 8 (ref 5–15)
BILIRUBIN TOTAL: 0.1 mg/dL — AB (ref 0.3–1.2)
BUN: 11 mg/dL (ref 6–20)
BUN: 11 mg/dL (ref 6–20)
CHLORIDE: 107 mmol/L (ref 101–111)
CHLORIDE: 104 mmol/L (ref 101–111)
CO2: 22 mmol/L (ref 22–32)
CO2: 27 mmol/L (ref 22–32)
CREATININE: 0.59 mg/dL (ref 0.44–1.00)
Calcium: 8.9 mg/dL (ref 8.9–10.3)
Calcium: 9.4 mg/dL (ref 8.9–10.3)
Creatinine, Ser: 0.8 mg/dL (ref 0.44–1.00)
GFR calc Af Amer: >60 mL/min (ref >60)
GFR calc non Af Amer: >60 mL/min (ref >60)
GLUCOSE: 102 mg/dL — AB (ref 65–99)
Glucose, Bld: 105 mg/dL — ABNORMAL HIGH (ref 65–99)
POTASSIUM: 3.9 mmol/L (ref 3.5–5.1)
Potassium: 3.8 mmol/L (ref 3.5–5.1)
SODIUM: 137 mmol/L (ref 135–145)
Sodium: 139 mmol/L (ref 135–145)
TOTAL PROTEIN: 7.2 g/dL (ref 6.5–8.1)
Total Bilirubin: 0.7 mg/dL (ref 0.3–1.2)
Total Protein: 6.8 g/dL (ref 6.5–8.1)

## 2015-11-16 LAB — URINALYSIS, ROUTINE W REFLEX MICROSCOPIC
Bilirubin Urine: NEGATIVE
Bilirubin Urine: NEGATIVE
GLUCOSE, UA: NEGATIVE mg/dL
GLUCOSE, UA: NEGATIVE mg/dL
KETONES UR: NEGATIVE mg/dL
Ketones, ur: NEGATIVE mg/dL
LEUKOCYTES UA: NEGATIVE
Leukocytes, UA: NEGATIVE
NITRITE: NEGATIVE
Nitrite: NEGATIVE
PH: 6 (ref 5.0–8.0)
PROTEIN: NEGATIVE mg/dL
Protein, ur: NEGATIVE mg/dL
Specific Gravity, Urine: 1.025 (ref 1.005–1.030)
Specific Gravity, Urine: 1.018 (ref 1.005–1.030)
pH: 5 (ref 5.0–8.0)

## 2015-11-16 LAB — URINE MICROSCOPIC-ADD ON: WBC UA: NONE SEEN WBC/hpf (ref 0–5)

## 2015-11-16 LAB — CBC
HCT: 39.2 % (ref 36.0–46.0)
HEMATOCRIT: 39.7 % (ref 36.0–46.0)
HEMOGLOBIN: 12.4 g/dL (ref 12.0–15.0)
Hemoglobin: 12.6 g/dL (ref 12.0–15.0)
MCH: 25.3 pg — AB (ref 26.0–34.0)
MCH: 24.6 pg — ABNORMAL LOW (ref 26.0–34.0)
MCHC: 32.1 g/dL (ref 30.0–36.0)
MCHC: 31.2 g/dL (ref 30.0–36.0)
MCV: 78.7 fL (ref 78.0–100.0)
MCV: 78.6 fL (ref 78.0–100.0)
PLATELETS: 378 10*3/uL (ref 150–400)
Platelets: 403 10*3/uL — ABNORMAL HIGH (ref 150–400)
RBC: 4.98 MIL/uL (ref 3.87–5.11)
RBC: 5.05 MIL/uL (ref 3.87–5.11)
RDW: 16.1 % — AB (ref 11.5–15.5)
RDW: 16 % — ABNORMAL HIGH (ref 11.5–15.5)
WBC: 9.1 10*3/uL (ref 4.0–10.5)
WBC: 8 10*3/uL (ref 4.0–10.5)

## 2015-11-16 LAB — DIFFERENTIAL
Basophils Absolute: 0 10*3/uL (ref 0.0–0.1)
Basophils Relative: 0 %
EOS PCT: 2 %
Eosinophils Absolute: 0.1 10*3/uL (ref 0.0–0.7)
LYMPHS PCT: 14 %
Lymphs Abs: 1.3 10*3/uL (ref 0.7–4.0)
MONO ABS: 0.4 10*3/uL (ref 0.1–1.0)
Monocytes Relative: 5 %
Neutro Abs: 7.1 10*3/uL (ref 1.7–7.7)
Neutrophils Relative %: 79 %

## 2015-11-16 LAB — LIPASE, BLOOD
LIPASE: 29 U/L (ref 11–51)
LIPASE: 33 U/L (ref 11–51)

## 2015-11-16 LAB — POC URINE PREG, ED: Preg Test, Ur: NEGATIVE

## 2015-11-16 MED ORDER — HYDROCODONE-ACETAMINOPHEN 5-325 MG PO TABS: 1.0000 | ORAL_TABLET | Freq: Four times a day (QID) | ORAL | Status: DC | PRN

## 2015-11-16 MED ORDER — HYDROMORPHONE HCL 1 MG/ML IJ SOLN
0.5000 mg | INTRAMUSCULAR | Status: DC | PRN
  Administered 2015-11-16: 0.5 mg via INTRAVENOUS
  Filled 2015-11-16: qty 1

## 2015-11-16 MED ORDER — SODIUM CHLORIDE 0.9 % IV BOLUS (SEPSIS)
1000.0000 mL | Freq: Once | INTRAVENOUS | Status: AC
  Administered 2015-11-16: 1000 mL via INTRAVENOUS

## 2015-11-16 MED ORDER — ONDANSETRON HCL 4 MG/2ML IJ SOLN
4.0000 mg | Freq: Once | INTRAMUSCULAR | Status: AC
  Administered 2015-11-16: 4 mg via INTRAVENOUS
  Filled 2015-11-16: qty 2

## 2015-11-16 MED ORDER — ONDANSETRON HCL 4 MG PO TABS: 4.0000 mg | ORAL_TABLET | Freq: Three times a day (TID) | ORAL | Status: AC | PRN

## 2015-11-16 MED ORDER — ONDANSETRON HCL 4 MG/2ML IJ SOLN
4.0000 mg | Freq: Once | INTRAMUSCULAR | Status: AC
Start: 2015-11-17 — End: 2015-11-16
  Administered 2015-11-16: 4 mg via INTRAVENOUS
  Filled 2015-11-16: qty 2

## 2015-11-16 MED ORDER — HYDROMORPHONE HCL 1 MG/ML IJ SOLN
1.0000 mg | Freq: Once | INTRAMUSCULAR | Status: AC
  Administered 2015-11-16: 1 mg via INTRAVENOUS
  Filled 2015-11-16: qty 1

## 2015-11-16 NOTE — ED Notes (Signed)
PT ambulated with baseline gait; VSS; A&Ox3; no signs of distress; respirations even and unlabored; skin warm and dry; no questions upon discharge.  

## 2015-11-16 NOTE — ED Provider Notes (Signed)
CSN: [id number]     Arrival date & time 11/16/15  2206 History   By signing my name below, I, Arlan Organ, attest that this documentation has been prepared under the direction and in the presence of Gerhard Munch, MD.  Electronically Signed: Arlan Organ, ED Scribe. 11/16/2015. 12:04 AM.   Chief Complaint  Patient presents with  . Abdominal Pain   The history is provided by the patient. No language interpreter was used.    HPI Comments: Madeline Stark is a 35 y.o. female with a PMHx of gallstones who presents to the Emergency Department complaining of constant, ongoing RUQ abdominal pain that radiates to the back onset 7:00 PM this evening. Pt also reports associated ongoing subjective fever, nausea and vomiting. No aggravating or alleviating factors at this time. Prescribed antiemetic and pain medication attempted prior to arrival without any improvement. Madeline Stark was evaluated this morning for same. At that time, RUQ ultrasound performed showing gallstones and distended gallbladder without signs of cholecystitis. Pain resolved after IV fluids, pain medication, and antibiotics. She was safely discharged home, however, pain and associated symptoms returned this evening. No recent cough, chills, or diarrhea.  PCP: No PCP Per Patient    Past Medical History  Diagnosis Date  . Medical history non-contributory   . Gallstones    Past Surgical History  Procedure Laterality Date  . Cesarean section     Family History  Problem Relation Age of Onset  . Cancer Maternal Grandfather   . Cancer Paternal Grandmother    Social History  Substance Use Topics  . Smoking status: Former Smoker    Quit date: 11/21/2012  . Smokeless tobacco: None  . Alcohol Use: No   OB History    Gravida Para Term Preterm AB TAB SAB Ectopic Multiple Living   3 2 2  0 0 0 0 0 0 2     Review of Systems  Constitutional: Positive for fever (subjective).       Per HPI, otherwise negative  HENT:        Per HPI, otherwise negative  Respiratory:       Per HPI, otherwise negative  Cardiovascular:       Per HPI, otherwise negative  Gastrointestinal: Positive for nausea, vomiting and abdominal pain.  Endocrine:       Negative aside from HPI  Genitourinary:       Neg aside from HPI   Musculoskeletal:       Per HPI, otherwise negative  Skin: Negative.   Neurological: Negative for syncope.      Allergies  Review of patient's allergies indicates no known allergies.  Home Medications   Prior to Admission medications   Medication Sig Start Date End Date Taking? Authorizing Provider  benzonatate (TESSALON) 100 MG capsule Take 1 capsule (100 mg total) by mouth every 8 (eight) hours. 09/07/15   Judeth Horn, NP  HYDROcodone-acetaminophen (NORCO/VICODIN) 5-325 MG tablet Take 1 tablet by mouth every 6 (six) hours as needed for moderate pain or severe pain. 11/16/15   Lavera Guise, MD  HYDROmorphone (DILAUDID) 2 MG tablet Take 1 tablet (2 mg total) by mouth every 4 (four) hours as needed for severe pain. 09/07/15   Judeth Horn, NP  ondansetron (ZOFRAN) 4 MG tablet Take 1 tablet (4 mg total) by mouth every 8 (eight) hours as needed for nausea or vomiting. 11/16/15   Lavera Guise, MD  ondansetron (ZOFRAN-ODT) 4 MG disintegrating tablet Take 1 tablet (4 mg total) by mouth every  8 (eight) hours as needed for nausea or vomiting. 09/07/15   Judeth HornErin Lawrence, NP  Prenatal Vit-Fe Fumarate-FA (PRENATAL MULTIVITAMIN) TABS tablet Take 1 tablet by mouth daily at 12 noon.    Historical Provider, MD  promethazine (PHENERGAN) 25 MG tablet Take 1 tablet (25 mg total) by mouth every 6 (six) hours as needed for nausea or vomiting. 09/07/15   Judeth HornErin Lawrence, NP  triamcinolone (KENALOG) 0.025 % ointment Apply 1 application topically 2 (two) times daily. 08/02/15   Kathrynn RunningNoah Bedford Wouk, MD   Triage Vitals: BP 140/88 mmHg  Pulse 63  Temp(Src) 98.4 F (36.9 C) (Oral)  Resp 20  SpO2 99%  LMP 11/08/2015 (Approximate)    Physical Exam  Constitutional: She is oriented to person, place, and time. She appears well-developed and well-nourished.  HENT:  Head: Normocephalic.  Eyes: EOM are normal.  Neck: Normal range of motion.  Cardiovascular: Normal rate, regular rhythm and normal heart sounds.   Pulmonary/Chest: Effort normal.  Abdominal: She exhibits no distension. There is tenderness.  Musculoskeletal: Normal range of motion.  Neurological: She is alert and oriented to person, place, and time.  Psychiatric: She has a normal mood and affect.  Nursing note and vitals reviewed.   ED Course  Procedures (including critical care time)  DIAGNOSTIC STUDIES: Oxygen Saturation is 99% on RA, Normal by my interpretation.    COORDINATION OF CARE: 11:41 PM-Will give fluids, Zofran, and Dilaudid. Will order US abdomen limited, Lipase, CMP, CBC, urinalysis, and urine pregnancy. Discussed treatment plan with pt at bedside and pt agreed to plan.     12:16 AM- Spoke with Dr. Janee Mornhompson and he will admit the pt.  Labs Review Labs Reviewed  COMPREHENSIVE METABOLIC PANEL - Abnormal; Notable for the following:    Glucose, Bld 105 (*)    Total Bilirubin 0.1 (*)    All other components within normal limits  CBC - Abnormal; Notable for the following:    MCH 24.6 (*)    RDW 16.0 (*)    Platelets 403 (*)    All other components within normal limits  URINALYSIS, ROUTINE W REFLEX MICROSCOPIC (NOT AT [facility]) - Abnormal; Notable for the following:    Hgb urine dipstick LARGE (*)    All other components within normal limits  URINE MICROSCOPIC-ADD ON - Abnormal; Notable for the following:    Squamous Epithelial / LPF 0-5 (*)    Bacteria, UA RARE (*)    All other components within normal limits  LIPASE, BLOOD  POC URINE PREG, ED    Imaging Review Koreas Abdomen Limited Ruq  11/16/2015  CLINICAL DATA:  Right upper quadrant pain for 3 days EXAM: US ABDOMEN LIMITED - RIGHT UPPER QUADRANT COMPARISON:  08/10/2015 FINDINGS:  Gallbladder: Gallstones including fixed in the neck. There is borderline wall thickening at 3-4 mm but no focal tenderness per sonographer exam and no convincing striation. The gallbladder is distended. Common bile duct: Diameter: 7 mm.  Where visualized, no filling defect. Liver: No focal lesion identified. Within normal limits in parenchymal echogenicity. IMPRESSION: 1. Cholelithiasis including stone fixed in the neck. The gallbladder is distended and could be obstructed. No definite inflammatory features for acute cholecystitis. 2. Dilated common bile duct without visible choledocholithiasis. Electronically Signed   By: Marnee SpringJonathon  Watts M.D.   On: 11/16/2015 09:29   I have personally reviewed and evaluated these images and lab results as part of my medical decision-making.  On repeat exam after initial fluids, analgesia, antiemetics, the patient has improvement in  her condition.  Chart review notable for evaluation over the past 18 hours with similar concerns, found to have biliary stone in the gallbladder neck.  12:51 AM I have discussed the patient is case with our surgical colleagues. Patient will be admitted for surgical evaluation.   MDM   I personally performed the services described in this documentation, which was scribed in my presence. The recorded information has been reviewed and is accurate.   Patient presents with ongoing episodic right upper quadrant pain. Here, the patient is awake, alert, but in substantial pain on arrival. With ultrasound from earlier today, there is concern for symptomatic cholelithiasis. Patient had reduction in symptoms, was admitted for further evaluation, management, likely surgical therapy.   Gerhard Munch, MD 11/17/15 [phone]

## 2015-11-16 NOTE — ED Notes (Signed)
Per interpreter: Pt c/o abdominal pain  X 1 week with NV onset tonight. Reports having problems with her gallbladder in the past; had recent pregnancy in November in which symptoms were treated with pain medications

## 2015-11-16 NOTE — ED Notes (Signed)
Report given to Josie DixonElizabeth P, RN.

## 2015-11-16 NOTE — ED Notes (Signed)
Pt. reports persistent RUQ radiating to right back with nausea and emesis , seen here this morning diagnosed with cholelithiasis ( blood and urine tests / ultrasound done ) prescribed with antiemetic and pain medication with no relief.

## 2015-11-16 NOTE — ED Provider Notes (Signed)
CSN: [id number]     Arrival date & time 11/16/15  0606 History   First MD Initiated Contact with Patient 11/16/15 [phone]     Chief Complaint  Patient presents with  . Abdominal Pain     (Consider location/radiation/quality/duration/timing/severity/associated sxs/prior Treatment) HPI  History obtained via Spanish interpreter. 35 year old female with history of cholelithiasis who presents with abdominal pain, nausea, and vomiting. Recently post partum since 09/28/2015. At 6 months pregnancy was diagnosed with gallstones but pregnancy too advanced for surgical removal. Minor RUQ pain that came and went at night since delivery, but tonight with severe RUQ abdominal pain and multiple episodes of N/V. Normal BM yesterday. No dysuria, frequency, hematuria, fever, chills, vaginal bleeding or discharge. No chest pain, sob or cough.  Past Medical History  Diagnosis Date  . Medical history non-contributory   . Gallstones    Past Surgical History  Procedure Laterality Date  . Cesarean section     Family History  Problem Relation Age of Onset  . Cancer Maternal Grandfather   . Cancer Paternal Grandmother    Social History  Substance Use Topics  . Smoking status: Former Smoker    Quit date: 11/21/2012  . Smokeless tobacco: None  . Alcohol Use: No   OB History    Gravida Para Term Preterm AB TAB SAB Ectopic Multiple Living   3 2 2  0 0 0 0 0 0 2     Review of Systems 10/14 systems reviewed and are negative other than those stated in the HPI    Allergies  Review of patient's allergies indicates no known allergies.  Home Medications   Prior to Admission medications   Medication Sig Start Date End Date Taking? Authorizing Provider  benzonatate (TESSALON) 100 MG capsule Take 1 capsule (100 mg total) by mouth every 8 (eight) hours. 09/07/15   Judeth Horn, NP  HYDROcodone-acetaminophen (NORCO/VICODIN) 5-325 MG tablet Take 1 tablet by mouth every 6 (six) hours as needed for moderate pain  or severe pain. 11/16/15   Lavera Guise, MD  HYDROmorphone (DILAUDID) 2 MG tablet Take 1 tablet (2 mg total) by mouth every 4 (four) hours as needed for severe pain. 09/07/15   Judeth Horn, NP  ondansetron (ZOFRAN) 4 MG tablet Take 1 tablet (4 mg total) by mouth every 8 (eight) hours as needed for nausea or vomiting. 11/16/15   Lavera Guise, MD  ondansetron (ZOFRAN-ODT) 4 MG disintegrating tablet Take 1 tablet (4 mg total) by mouth every 8 (eight) hours as needed for nausea or vomiting. 09/07/15   Judeth Horn, NP  Prenatal Vit-Fe Fumarate-FA (PRENATAL MULTIVITAMIN) TABS tablet Take 1 tablet by mouth daily at 12 noon.    Historical Provider, MD  promethazine (PHENERGAN) 25 MG tablet Take 1 tablet (25 mg total) by mouth every 6 (six) hours as needed for nausea or vomiting. 09/07/15   Judeth Horn, NP  triamcinolone (KENALOG) 0.025 % ointment Apply 1 application topically 2 (two) times daily. 08/02/15   Kathrynn Running, MD   BP 103/68 mmHg  Pulse 59  Temp(Src) 98.4 F (36.9 C) (Oral)  Resp 20  SpO2 100%  LMP 01/12/2015  Breastfeeding? Unknown Physical Exam Physical Exam  Nursing note and vitals reviewed. Constitutional: Well developed, well nourished, non-toxic, and in no acute distress but appears uncomfortable Head: Normocephalic and atraumatic.  Mouth/Throat: Oropharynx is clear and moist.  Neck: Normal range of motion. Neck supple.  Cardiovascular: Normal rate and regular rhythm.   Pulmonary/Chest: Effort normal and  breath sounds normal.  Abdominal: Soft. There is epigastric and RUQ tenderness. There is no rebound and no guarding.  Musculoskeletal: Normal range of motion.  Neurological: Alert, no facial droop, fluent speech, moves all extremities symmetrically Skin: Skin is warm and dry.  Psychiatric: Cooperative  ED Course  Procedures (including critical care time) Labs Review Labs Reviewed  COMPREHENSIVE METABOLIC PANEL - Abnormal; Notable for the following:    Glucose, Bld  102 (*)    All other components within normal limits  CBC - Abnormal; Notable for the following:    MCH 25.3 (*)    RDW 16.1 (*)    All other components within normal limits  URINALYSIS, ROUTINE W REFLEX MICROSCOPIC (NOT AT [facility]) - Abnormal; Notable for the following:    Hgb urine dipstick LARGE (*)    All other components within normal limits  URINE MICROSCOPIC-ADD ON - Abnormal; Notable for the following:    Squamous Epithelial / LPF 0-5 (*)    Bacteria, UA FEW (*)    All other components within normal limits  LIPASE, BLOOD  DIFFERENTIAL    Imaging Review [place] Abdomen Limited Ruq  11/16/2015  CLINICAL DATA:  Right upper quadrant pain for 3 days EXAM: US ABDOMEN LIMITED - RIGHT UPPER QUADRANT COMPARISON:  08/10/2015 FINDINGS: Gallbladder: Gallstones including fixed in the neck. There is borderline wall thickening at 3-4 mm but no focal tenderness per sonographer exam and no convincing striation. The gallbladder is distended. Common bile duct: Diameter: 7 mm.  Where visualized, no filling defect. Liver: No focal lesion identified. Within normal limits in parenchymal echogenicity. IMPRESSION: 1. Cholelithiasis including stone fixed in the neck. The gallbladder is distended and could be obstructed. No definite inflammatory features for acute cholecystitis. 2. Dilated common bile duct without visible choledocholithiasis. Electronically Signed   By: Marnee SpringJonathon  Watts M.D.   On: 11/16/2015 09:29   I have personally reviewed and evaluated these images and lab results as part of my medical decision-making.   EKG Interpretation None      MDM   Final diagnoses:  RUQ pain  Symptomatic cholelithiasis    35 year old female who is presenting with symptomatic cholelithiasis. Is afebrile and hemodynamically stable. Has a soft and non-peritoneal abdomen with right upper quadrant tenderness to palpation. Has an unremarkable CBC, comprehensive metabolic panel, and lipase. Right upper quadrant ultrasound  with gallstones and distended gallbladder but no secondary signs of cholecystitis. Pain fully relieved after IV fluid fluids, antibiotics, and analgesics. On reevaluation has a soft and nontender abdomen. She is also able to tolerate by mouth intake without recurrence of symptoms here.Discussed outpatient follow-up with general surgery for elective elective cholecystectomy. Given referral. Strict return and follow-up instructions are reviewed via Spanish interpreter. She expected understanding of all discharge instructions and felt comfortable to plan of care.    Lavera Guiseana Duo Brooksie Ellwanger, MD 11/16/15 [phone]

## 2015-11-16 NOTE — Discharge Instructions (Signed)
Colelitiasis  (Cholelithiasis)  La colelitiasis (también llamada cálculos en la vesícula) es una enfermedad en la que se forman piedras en la vesícula. La vesícula es un órgano que almacena la bilis que se forma en el hígado y que ayuda a digerir grasas. Los cálculos comienzan como pequeños cristales y lentamente se transforman en piedras. El dolor en la vesícula ocurre cuando se producen espasmos y los cálculos obstruyen el conducto. El dolor también se produce cuando una piedra sale por el conducto.   FACTORES DE RIESGO  · Ser mujer.    · Tener embarazos múltiples. Algunas veces los médicos aconsejan extirpar los cálculos biliares antes de futuros embarazos.    · Ser obeso.  · Dietas que incluyan comidas fritas y grasas.    · Ser mayor de 60 años y el aumento de la edad.    · El uso prolongado de medicamentos que contengan hormonas femeninas.    · Tener diabetes mellitus.    · Pérdida rápida de peso.    · Historia familiar de cálculos (herencia).    SÍNTOMAS  · Náuseas.    · Vómitos.  · Dolor abdominal.    · Piel amarilla (ictericia)    · Dolor súbito. Puede persistir desde algunos minutos hasta algunas horas.  · Fiebre.    · Sensibilidad al tacto.   En algunos casos, cuando los cálculos biliares no se mueven hacia el conducto biliar, las personas no sienten dolor ni presentan síntomas. Estos se denominan cálculos "silenciosos".   TRATAMIENTO  Los cálculos silenciosos no requieren tratamiento. En los casos graves, podrá ser necesaria una cirugía de urgencia. Las opciones de tratamiento son:  · Cirugía para extirpar la vesícula. Es el tratamiento más frecuente.  · Medicamentos. No siempre dan resultado y pueden demorar entre 6 y 12 meses o más en hacer efecto.  · Tratamiento con ondas de choque (litotricia biliar extracorporal). En este tratamiento, una máquina de ultrasonido envía ondas de choque a la vesícula para destruir los cálculos en pequeños fragmentos que luego podrán pasar a los intestinos o ser disueltas  con medicamentos.  INSTRUCCIONES PARA EL CUIDADO EN EL HOGAR   · Sólo tome medicamentos de venta libre o recetados para calmar el dolor, el malestar o bajar la fiebre, según las indicaciones de su médico.    · Siga una dieta baja en grasas hasta que su médico lo vea nuevamente. Las grasas hacen que la vesícula se contraiga, lo que puede producir dolor.    · Concurra a las consultas de control con su médico según las indicaciones. Los ataques casi siempre son recurrentes y generalmente habrá que someterse a una cirugía como tratamiento permanente.    SOLICITE ATENCIÓN MÉDICA DE INMEDIATO SI:   · El dolor aumenta y no puede controlarlo con los medicamentos.    · Tiene fiebre o síntomas persistentes durante más de 2 - 3 días.    · Tiene fiebre y los síntomas empeoran repentinamente.    · Tiene náuseas o vómitos persistentes.    ASEGÚRESE DE QUE:   · Comprende estas instrucciones.  · Controlará su afección.  · Recibirá ayuda de inmediato si no mejora o si empeora.     Esta información no tiene como fin reemplazar el consejo del médico. Asegúrese de hacerle al médico cualquier pregunta que tenga.     Document Released: 08/14/2006 Document Revised: 06/30/2013  Elsevier Interactive Patient Education ©2016 [employer]

## 2015-11-16 NOTE — ED Notes (Signed)
Pt reports RUQ abd pain that radiates to back since yesterday; pt reports hx of gallbladder problems but could not have surgery until after pregnancy; pt reports vomitting 6 times since yesterday and rates pain as 20/10

## 2015-11-16 NOTE — ED Notes (Signed)
MD at bedside updating pt on results.

## 2015-11-16 NOTE — ED Notes (Signed)
Pt eating and drinking crackers and ginger ale. No issues.

## 2015-11-17 ENCOUNTER — Inpatient Hospital Stay (HOSPITAL_COMMUNITY): Payer: Self-pay | Admitting: Certified Registered Nurse Anesthetist

## 2015-11-17 ENCOUNTER — Inpatient Hospital Stay (HOSPITAL_COMMUNITY): Payer: Self-pay

## 2015-11-17 ENCOUNTER — Encounter (HOSPITAL_COMMUNITY): Admission: EM | Disposition: A | Discharge status: Home / Self Care | Payer: Self-pay | Source: Home / Self Care

## 2015-11-17 ENCOUNTER — Encounter (HOSPITAL_COMMUNITY): Payer: Self-pay | Admitting: Certified Registered Nurse Anesthetist

## 2015-11-17 DIAGNOSIS — K8020 Calculus of gallbladder without cholecystitis without obstruction: Secondary | ICD-10-CM | POA: Diagnosis present

## 2015-11-17 HISTORY — PX: CHOLECYSTECTOMY: SHX55

## 2015-11-17 LAB — SURGICAL PCR SCREEN
MRSA, PCR: NEGATIVE
Staphylococcus aureus: POSITIVE — AB

## 2015-11-17 SURGERY — LAPAROSCOPIC CHOLECYSTECTOMY WITH INTRAOPERATIVE CHOLANGIOGRAM
Anesthesia: General | Site: Abdomen

## 2015-11-17 MED ORDER — HYDROMORPHONE HCL 1 MG/ML IJ SOLN
0.2500 mg | INTRAMUSCULAR | Status: DC | PRN
  Administered 2015-11-17 (×2): 0.5 mg via INTRAVENOUS

## 2015-11-17 MED ORDER — SODIUM CHLORIDE 0.9 % IV SOLN
INTRAVENOUS | Status: DC | PRN
  Administered 2015-11-17: 13 mL

## 2015-11-17 MED ORDER — ONDANSETRON HCL 4 MG/2ML IJ SOLN
INTRAMUSCULAR | Status: DC | PRN
  Administered 2015-11-17: 4 mg via INTRAVENOUS

## 2015-11-17 MED ORDER — PANTOPRAZOLE SODIUM 40 MG IV SOLR
40.0000 mg | Freq: Every day | INTRAVENOUS | Status: DC
  Administered 2015-11-17 (×2): 40 mg via INTRAVENOUS
  Filled 2015-11-17 (×2): qty 40

## 2015-11-17 MED ORDER — OXYCODONE HCL 5 MG/5ML PO SOLN: 5.0000 mg | Freq: Once | ORAL | Status: DC | PRN

## 2015-11-17 MED ORDER — SODIUM CHLORIDE 0.9 % IR SOLN
Status: DC | PRN
  Administered 2015-11-17: 1000 mL

## 2015-11-17 MED ORDER — DEXTROSE 5 % IV SOLN
2.0000 g | Freq: Every day | INTRAVENOUS | Status: DC
  Administered 2015-11-17 (×2): 2 g via INTRAVENOUS
  Filled 2015-11-17 (×3): qty 2

## 2015-11-17 MED ORDER — ONDANSETRON 4 MG PO TBDP: 4.0000 mg | ORAL_TABLET | Freq: Four times a day (QID) | ORAL | Status: DC | PRN

## 2015-11-17 MED ORDER — KETOROLAC TROMETHAMINE 30 MG/ML IJ SOLN
30.0000 mg | Freq: Once | INTRAMUSCULAR | Status: AC
  Administered 2015-11-17: 30 mg via INTRAVENOUS

## 2015-11-17 MED ORDER — LIDOCAINE HCL (CARDIAC) 20 MG/ML IV SOLN
INTRAVENOUS | Status: AC
  Filled 2015-11-17: qty 5

## 2015-11-17 MED ORDER — MUPIROCIN 2 % EX OINT
TOPICAL_OINTMENT | CUTANEOUS | Status: AC
  Filled 2015-11-17: qty 22

## 2015-11-17 MED ORDER — 0.9 % SODIUM CHLORIDE (POUR BTL) OPTIME
TOPICAL | Status: DC | PRN
  Administered 2015-11-17: 1000 mL

## 2015-11-17 MED ORDER — GLYCOPYRROLATE 0.2 MG/ML IJ SOLN
INTRAMUSCULAR | Status: DC | PRN
  Administered 2015-11-17: .6 mg via INTRAVENOUS

## 2015-11-17 MED ORDER — PROMETHAZINE HCL 25 MG/ML IJ SOLN: 6.2500 mg | INTRAMUSCULAR | Status: DC | PRN

## 2015-11-17 MED ORDER — OXYCODONE HCL 5 MG PO TABS: 5.0000 mg | ORAL_TABLET | Freq: Once | ORAL | Status: DC | PRN

## 2015-11-17 MED ORDER — CHLORHEXIDINE GLUCONATE CLOTH 2 % EX PADS: 6.0000 | MEDICATED_PAD | Freq: Every day | CUTANEOUS | Status: DC

## 2015-11-17 MED ORDER — ONDANSETRON HCL 4 MG/2ML IJ SOLN: 4.0000 mg | Freq: Four times a day (QID) | INTRAMUSCULAR | Status: DC | PRN

## 2015-11-17 MED ORDER — PROPOFOL 10 MG/ML IV BOLUS
INTRAVENOUS | Status: DC | PRN
  Administered 2015-11-17: 150 mg via INTRAVENOUS

## 2015-11-17 MED ORDER — HEMOSTATIC AGENTS (NO CHARGE) OPTIME
TOPICAL | Status: DC | PRN
  Administered 2015-11-17: 1 via TOPICAL

## 2015-11-17 MED ORDER — MIDAZOLAM HCL 5 MG/5ML IJ SOLN
INTRAMUSCULAR | Status: DC | PRN
  Administered 2015-11-17: 2 mg via INTRAVENOUS

## 2015-11-17 MED ORDER — LACTATED RINGERS IV SOLN
INTRAVENOUS | Status: DC | PRN
  Administered 2015-11-17: 07:00:00 via INTRAVENOUS

## 2015-11-17 MED ORDER — FENTANYL CITRATE (PF) 100 MCG/2ML IJ SOLN
INTRAMUSCULAR | Status: DC | PRN
  Administered 2015-11-17: 50 ug via INTRAVENOUS
  Administered 2015-11-17: 100 ug via INTRAVENOUS
  Administered 2015-11-17 (×2): 50 ug via INTRAVENOUS

## 2015-11-17 MED ORDER — FENTANYL CITRATE (PF) 250 MCG/5ML IJ SOLN
INTRAMUSCULAR | Status: AC
  Filled 2015-11-17: qty 5

## 2015-11-17 MED ORDER — NEOSTIGMINE METHYLSULFATE 10 MG/10ML IV SOLN
INTRAVENOUS | Status: DC | PRN
  Administered 2015-11-17: 4 mg via INTRAVENOUS

## 2015-11-17 MED ORDER — BUPIVACAINE-EPINEPHRINE 0.25% -1:200000 IJ SOLN
INTRAMUSCULAR | Status: DC | PRN
  Administered 2015-11-17: 9 mL

## 2015-11-17 MED ORDER — KETOROLAC TROMETHAMINE 30 MG/ML IJ SOLN
INTRAMUSCULAR | Status: AC
  Filled 2015-11-17: qty 1

## 2015-11-17 MED ORDER — LIDOCAINE HCL (CARDIAC) 20 MG/ML IV SOLN
INTRAVENOUS | Status: DC | PRN
  Administered 2015-11-17: 60 mg via INTRAVENOUS

## 2015-11-17 MED ORDER — ONDANSETRON HCL 4 MG/2ML IJ SOLN
INTRAMUSCULAR | Status: AC
  Filled 2015-11-17: qty 2

## 2015-11-17 MED ORDER — MORPHINE SULFATE (PF) 2 MG/ML IV SOLN
2.0000 mg | INTRAVENOUS | Status: DC | PRN
  Administered 2015-11-18 (×2): 2 mg via INTRAVENOUS
  Filled 2015-11-17 (×2): qty 1

## 2015-11-17 MED ORDER — KCL IN DEXTROSE-NACL 20-5-0.45 MEQ/L-%-% IV SOLN
INTRAVENOUS | Status: DC
  Administered 2015-11-17 – 2015-11-18 (×4): via INTRAVENOUS
  Filled 2015-11-17 (×4): qty 1000

## 2015-11-17 MED ORDER — MIDAZOLAM HCL 2 MG/2ML IJ SOLN
INTRAMUSCULAR | Status: AC
  Filled 2015-11-17: qty 2

## 2015-11-17 MED ORDER — BUPIVACAINE-EPINEPHRINE (PF) 0.25% -1:200000 IJ SOLN
INTRAMUSCULAR | Status: AC
  Filled 2015-11-17: qty 30

## 2015-11-17 MED ORDER — OXYCODONE-ACETAMINOPHEN 5-325 MG PO TABS
1.0000 | ORAL_TABLET | ORAL | Status: DC | PRN
  Administered 2015-11-17 – 2015-11-18 (×3): 1 via ORAL
  Filled 2015-11-17 (×3): qty 1

## 2015-11-17 MED ORDER — ROCURONIUM BROMIDE 50 MG/5ML IV SOLN
INTRAVENOUS | Status: AC
  Filled 2015-11-17: qty 1

## 2015-11-17 MED ORDER — MUPIROCIN 2 % EX OINT
1.0000 "application " | TOPICAL_OINTMENT | Freq: Two times a day (BID) | CUTANEOUS | Status: DC
  Administered 2015-11-17 – 2015-11-18 (×3): 1 via NASAL
  Filled 2015-11-17: qty 22

## 2015-11-17 MED ORDER — HYDROMORPHONE HCL 1 MG/ML IJ SOLN
INTRAMUSCULAR | Status: AC
  Filled 2015-11-17: qty 1

## 2015-11-17 MED ORDER — PROPOFOL 10 MG/ML IV BOLUS
INTRAVENOUS | Status: AC
  Filled 2015-11-17: qty 20

## 2015-11-17 MED ORDER — ROCURONIUM BROMIDE 100 MG/10ML IV SOLN
INTRAVENOUS | Status: DC | PRN
  Administered 2015-11-17: 40 mg via INTRAVENOUS

## 2015-11-17 SURGICAL SUPPLY — 41 items
APPLIER CLIP ROT 10 11.4 M/L (STAPLE) ×6
BLADE SURG ROTATE 9660 (MISCELLANEOUS) IMPLANT
CANISTER SUCTION 2500CC (MISCELLANEOUS) ×3 IMPLANT
CHLORAPREP W/TINT 26ML (MISCELLANEOUS) ×3 IMPLANT
CLIP APPLIE ROT 10 11.4 M/L (STAPLE) ×2 IMPLANT
COVER MAYO STAND STRL (DRAPES) ×3 IMPLANT
COVER SURGICAL LIGHT HANDLE (MISCELLANEOUS) ×3 IMPLANT
DRAPE C-ARM 42X72 X-RAY (DRAPES) ×3 IMPLANT
DRAPE WARM FLUID 44X44 (DRAPE) ×3 IMPLANT
ELECT REM PT RETURN 9FT ADLT (ELECTROSURGICAL) ×3
ELECTRODE REM PT RTRN 9FT ADLT (ELECTROSURGICAL) ×1 IMPLANT
GLOVE BIO SURGEON STRL SZ7 (GLOVE) ×6 IMPLANT
GLOVE BIO SURGEON STRL SZ8 (GLOVE) ×3 IMPLANT
GLOVE BIOGEL PI IND STRL 7.0 (GLOVE) ×3 IMPLANT
GLOVE BIOGEL PI IND STRL 8 (GLOVE) ×1 IMPLANT
GLOVE BIOGEL PI INDICATOR 7.0 (GLOVE) ×6
GLOVE BIOGEL PI INDICATOR 8 (GLOVE) ×2
GOWN STRL REUS W/ TWL LRG LVL3 (GOWN DISPOSABLE) ×2 IMPLANT
GOWN STRL REUS W/ TWL XL LVL3 (GOWN DISPOSABLE) ×1 IMPLANT
GOWN STRL REUS W/TWL LRG LVL3 (GOWN DISPOSABLE) ×4
GOWN STRL REUS W/TWL XL LVL3 (GOWN DISPOSABLE) ×2
HEMOSTAT SNOW SURGICEL 2X4 (HEMOSTASIS) ×3 IMPLANT
KIT BASIN OR (CUSTOM PROCEDURE TRAY) ×3 IMPLANT
KIT ROOM TURNOVER OR (KITS) ×3 IMPLANT
LIQUID BAND (GAUZE/BANDAGES/DRESSINGS) ×3 IMPLANT
NS IRRIG 1000ML POUR BTL (IV SOLUTION) ×3 IMPLANT
PAD ARMBOARD 7.5X6 YLW CONV (MISCELLANEOUS) ×3 IMPLANT
POUCH SPECIMEN RETRIEVAL 10MM (ENDOMECHANICALS) ×3 IMPLANT
SCISSORS LAP 5X35 DISP (ENDOMECHANICALS) ×3 IMPLANT
SET CHOLANGIOGRAPH 5 50 .035 (SET/KITS/TRAYS/PACK) ×3 IMPLANT
SET IRRIG TUBING LAPAROSCOPIC (IRRIGATION / IRRIGATOR) ×3 IMPLANT
SLEEVE ENDOPATH XCEL 5M (ENDOMECHANICALS) ×3 IMPLANT
SPECIMEN JAR SMALL (MISCELLANEOUS) ×3 IMPLANT
SUT MNCRL AB 4-0 PS2 18 (SUTURE) ×3 IMPLANT
TOWEL OR 17X24 6PK STRL BLUE (TOWEL DISPOSABLE) ×3 IMPLANT
TOWEL OR 17X26 10 PK STRL BLUE (TOWEL DISPOSABLE) ×3 IMPLANT
TRAY LAPAROSCOPIC MC (CUSTOM PROCEDURE TRAY) ×3 IMPLANT
TROCAR XCEL BLUNT TIP 100MML (ENDOMECHANICALS) ×3 IMPLANT
TROCAR XCEL NON-BLD 11X100MML (ENDOMECHANICALS) ×3 IMPLANT
TROCAR XCEL NON-BLD 5MMX100MML (ENDOMECHANICALS) ×3 IMPLANT
TUBING INSUFFLATION (TUBING) ×3 IMPLANT

## 2015-11-17 NOTE — Anesthesia Postprocedure Evaluation (Signed)
Anesthesia Post Note  Patient: Madeline Stark  Procedure(s) Performed: Procedure(s) (LRB): LAPAROSCOPIC CHOLECYSTECTOMY WITH INTRAOPERATIVE CHOLANGIOGRAM (N/A)  Patient location during evaluation: PACU Anesthesia Type: General Level of consciousness: awake and alert Pain management: pain level controlled Vital Signs Assessment: post-procedure vital signs reviewed and stable Respiratory status: spontaneous breathing Cardiovascular status: blood pressure returned to baseline Anesthetic complications: no    Last Vitals:  Filed Vitals:   11/17/15 0950 11/17/15 1007  BP:  119/75  Pulse:  60  Temp: 36.6 C 36.2 C  Resp:  20    Last Pain:  Filed Vitals:   11/17/15 1040  PainSc: Asleep                 Kennieth RadFitzgerald, Joson Sapp E

## 2015-11-17 NOTE — Anesthesia Procedure Notes (Signed)
Procedure Name: Intubation Date/Time: 11/17/2015 7:38 AM Performed by: Dairl PonderJIANG, Sukhraj Esquivias Pre-anesthesia Checklist: Patient identified, Timeout performed, Emergency Drugs available, Suction available and Patient being monitored Patient Re-evaluated:Patient Re-evaluated prior to inductionOxygen Delivery Method: Circle system utilized Preoxygenation: Pre-oxygenation with 100% oxygen Intubation Type: IV induction Ventilation: Mask ventilation without difficulty Laryngoscope Size: Mac and 3 Grade View: Grade I Tube type: Oral Tube size: 7.0 mm Number of attempts: 1 Airway Equipment and Method: Stylet Placement Confirmation: ETT inserted through vocal cords under direct vision,  breath sounds checked- equal and bilateral and positive ETCO2 Secured at: 21 cm Tube secured with: Tape Dental Injury: Teeth and Oropharynx as per pre-operative assessment

## 2015-11-17 NOTE — Progress Notes (Signed)
Arrived to room 6n11 from PACU, very sleepy but easily arousable., no family at bedside

## 2015-11-17 NOTE — Op Note (Signed)
Laparoscopic Cholecystectomy with IOC Procedure Note  Indications: This patient presents with symptomatic gallbladder disease and will undergo laparoscopic cholecystectomy.The procedure has been discussed with the patient. Operative and non operative treatments have been discussed. Risks of surgery include bleeding, infection,  Common bile duct injury,  Injury to the stomach,liver, colon,small intestine, abdominal wall,  Diaphragm,  Major blood vessels,  And the need for an open procedure.  Other risks include worsening of medical problems, death,  DVT and pulmonary embolism, and cardiovascular events.   Medical options have also been discussed. The patient has been informed of long term expectations of surgery and non surgical options,  The patient agrees to proceed.    Pre-operative Diagnosis: Calculus of gallbladder with other cholecystitis, without mention of obstruction  Post-operative Diagnosis: Same  Surgeon: Roselie Cirigliano A.   Assistants: none   Anesthesia: General endotracheal anesthesia and Local anesthesia 0.25.% bupivacaine, with epinephrine  ASA Class: 2  Procedure Details  The patient was seen again in the Holding Room. The risks, benefits, complications, treatment options, and expected outcomes were discussed with the patient. The possibilities of reaction to medication, pulmonary aspiration, perforation of viscus, bleeding, recurrent infection, finding a normal gallbladder, the need for additional procedures, failure to diagnose a condition, the possible need to convert to an open procedure, and creating a complication requiring transfusion or operation were discussed with the patient. The patient and/or family concurred with the proposed plan, giving informed consent. The site of surgery properly noted/marked. The patient was taken to Operating Room, identified as [facility] and the procedure verified as Laparoscopic Cholecystectomy with Intraoperative Cholangiograms. A  Time Out was held and the above information confirmed.  Prior to the induction of general anesthesia, antibiotic prophylaxis was administered. General endotracheal anesthesia was then administered and tolerated well. After the induction, the abdomen was prepped in the usual sterile fashion. The patient was positioned in the supine position with the left arm comfortably tucked, along with some reverse Trendelenburg.  Local anesthetic agent was injected into the skin near the umbilicus and an incision made. The midline fascia was incised and the Hasson technique was used to introduce a 12 mm port under direct vision. It was secured with a figure of eight Vicryl suture placed in the usual fashion. Pneumoperitoneum was then created with CO2 and tolerated well without any adverse changes in the patient's vital signs. Additional trocars were introduced under direct vision with an 11 mm trocar in the epigastrium and 2 5 mm trocars in the right upper quadrant. All skin incisions were infiltrated with a local anesthetic agent before making the incision and placing the trocars.   The gallbladder was identified, the fundus grasped and retracted cephalad. Adhesions were lysed bluntly and with the electrocautery where indicated, taking care not to injure any adjacent organs or viscus. The infundibulum was grasped and retracted laterally, exposing the peritoneum overlying the triangle of Calot. This was then divided and exposed in a blunt fashion. The cystic duct was clearly identified and bluntly dissected circumferentially. The junctions of the gallbladder, cystic duct and common bile duct were clearly identified prior to the division of any linear structure.   An incision was made in the cystic duct and the cholangiogram catheter introduced. The catheter was secured using an endoclip. The study showed no stones and good visualization of the distal and proximal biliary tree. The catheter was then removed.   The cystic  duct was then  ligated with surgical clips  on the patient side and  clipped on the gallbladder side and divided. The cystic artery was identified, dissected free, ligated with clips and divided as well. Posterior cystic artery clipped and divided.  The gallbladder was dissected from the liver bed in retrograde fashion with the electrocautery. The gallbladder was removed. The liver bed was irrigated and inspected. Hemostasis was achieved with the electrocautery. Copious irrigation was utilized and was repeatedly aspirated until clear all particulate matter. Hemostasis was achieved with no signs  Of bleeding or bile leakage.  Pneumoperitoneum was completely reduced after viewing removal of the trocars under direct vision. The wound was thoroughly irrigated and the fascia was then closed with a figure of eight suture; the skin was then closed with 4 O monocryl  and liquid adhesive  was applied.  Instrument, sponge, and needle counts were correct at closure and at the conclusion of the case.   Findings: Cholecystitis with Cholelithiasis  Estimated Blood Loss: Minimal         Drains: none         Total IV Fluids: 600 mL         Specimens: Gallbladder           Complications: None; patient tolerated the procedure well.         Disposition: PACU - hemodynamically stable.         Condition: stable

## 2015-11-17 NOTE — Transfer of Care (Signed)
Immediate Anesthesia Transfer of Care Note  Patient: Madeline Stark  Procedure(s) Performed: Procedure(s): LAPAROSCOPIC CHOLECYSTECTOMY WITH INTRAOPERATIVE CHOLANGIOGRAM (N/A)  Patient Location: PACU  Anesthesia Type:General  Level of Consciousness: awake  Airway & Oxygen Therapy: Patient Spontanous Breathing and Patient connected to nasal cannula oxygen  Post-op Assessment: Report given to RN and Post -op Vital signs reviewed and stable  Post vital signs: Reviewed  Last Vitals:  Filed Vitals:   11/17/15 0144 11/17/15 0600  BP: 104/73 132/58  Pulse: 68 78  Temp: 36.5 C 36.7 C  Resp: 19 18    Complications: No apparent anesthesia complications

## 2015-11-17 NOTE — Anesthesia Preprocedure Evaluation (Addendum)
Anesthesia Evaluation  Patient identified by MRN, date of birth, ID band Patient awake    Reviewed: Allergy & Precautions, NPO status , Patient's Chart, lab work & pertinent test results  Airway Mallampati: II  TM Distance: >3 FB Neck ROM: Full    Dental  (+) Teeth Intact   Pulmonary former smoker,    Pulmonary exam normal breath sounds clear to auscultation       Cardiovascular negative cardio ROS Normal cardiovascular exam Rhythm:Regular Rate:Normal     Neuro/Psych negative neurological ROS     GI/Hepatic negative GI ROS, Neg liver ROS,   Endo/Other  negative endocrine ROS  Renal/GU negative Renal ROS     Musculoskeletal   Abdominal (+) + obese,   Peds  Hematology negative hematology ROS (+)   Anesthesia Other Findings   Reproductive/Obstetrics                           Lab Results  Component Value Date   WBC 8.0 11/16/2015   HGB 12.4 11/16/2015   HCT 39.7 11/16/2015   MCV 78.6 11/16/2015   PLT 403* 11/16/2015   Lab Results  Component Value Date   CREATININE 0.80 11/16/2015   BUN 11 11/16/2015   NA 139 11/16/2015   K 3.9 11/16/2015   CL 104 11/16/2015   CO2 27 11/16/2015    Anesthesia Physical Anesthesia Plan  ASA: II  Anesthesia Plan: General   Post-op Pain Management:    Induction: Intravenous  Airway Management Planned: Oral ETT  Additional Equipment:   Intra-op Plan:   Post-operative Plan: Extubation in OR  Informed Consent: I have reviewed the patients History and Physical, chart, labs and discussed the procedure including the risks, benefits and alternatives for the proposed anesthesia with the patient or authorized representative who has indicated his/her understanding and acceptance.   Dental advisory given  Plan Discussed with: Anesthesiologist, Surgeon and CRNA  Anesthesia Plan Comments:       Anesthesia Quick Evaluation

## 2015-11-17 NOTE — H&P (Signed)
Madeline Stark is an 35 y.o. female.   Chief Complaint: RUQ pain HPI: [name] initially developed right upper quadrant pain during a recent pregnancy. She was diagnosed with gallstones at that time. She was hoping, once she delivered, she would not have any more problems with it. Her daughter is now about 73 weeks old. The right upper quadrant pain returned and she came to the emergency department this morning. She was diagnosed with symptomatically cholelithiasis, pain was relieved, and she was discharged. The pain came back this evening and she returned to the emergency department. Labs were repeated and remained unremarkable. I was asked to see her for admission. She did have associated nausea and vomiting along with the right upper quadrant pain.  Past Medical History  Diagnosis Date  . Medical history non-contributory   . Gallstones     Past Surgical History  Procedure Laterality Date  . Cesarean section      Family History  Problem Relation Age of Onset  . Cancer Maternal Grandfather   . Cancer Paternal Grandmother    Social History:  reports that she quit smoking about 2 years ago. She does not have any smokeless tobacco history on file. She reports that she does not drink alcohol or use illicit drugs.  Allergies: No Known Allergies   (Not in a hospital admission)  Results for orders placed or performed during the hospital encounter of 11/16/15 (from the past 48 hour(s))  POC urine preg, ED (not at [facility])     Status: None   Collection Time: 11/16/15 10:51 PM  Result Value Ref Range   Preg Test, Ur NEGATIVE NEGATIVE    Comment:        THE SENSITIVITY OF THIS METHODOLOGY IS >24 mIU/mL   Lipase, blood     Status: None   Collection Time: 11/16/15 10:54 PM  Result Value Ref Range   Lipase 33 11 - 51 U/L  Comprehensive metabolic panel     Status: Abnormal   Collection Time: 11/16/15 10:54 PM  Result Value Ref Range   Sodium 139 135 - 145 mmol/L   Potassium 3.9 3.5 - 5.1  mmol/L   Chloride 104 101 - 111 mmol/L   CO2 27 22 - 32 mmol/L   Glucose, Bld 105 (H) 65 - 99 mg/dL   BUN 11 6 - 20 mg/dL   Creatinine, Ser 0.80 0.44 - 1.00 mg/dL   Calcium 9.4 8.9 - 10.3 mg/dL   Total Protein 7.2 6.5 - 8.1 g/dL   Albumin 4.0 3.5 - 5.0 g/dL   AST 27 15 - 41 U/L   ALT 47 14 - 54 U/L   Alkaline Phosphatase 107 38 - 126 U/L   Total Bilirubin 0.1 (L) 0.3 - 1.2 mg/dL   GFR calc non Af Amer >60 >60 mL/min   GFR calc Af Amer >60 >60 mL/min    Comment: (NOTE) The eGFR has been calculated using the CKD EPI equation. This calculation has not been validated in all clinical situations. eGFR's persistently <60 mL/min signify possible Chronic Kidney Disease.    Anion gap 8 5 - 15  CBC     Status: Abnormal   Collection Time: 11/16/15 10:54 PM  Result Value Ref Range   WBC 8.0 4.0 - 10.5 K/uL   RBC 5.05 3.87 - 5.11 MIL/uL   Hemoglobin 12.4 12.0 - 15.0 g/dL   HCT 39.7 36.0 - 46.0 %   MCV 78.6 78.0 - 100.0 fL   MCH 24.6 (L) 26.0 -  34.0 pg   MCHC 31.2 30.0 - 36.0 g/dL   RDW 16.0 (H) 11.5 - 15.5 %   Platelets 403 (H) 150 - 400 K/uL  Urinalysis, Routine w reflex microscopic (not at [facility])     Status: Abnormal   Collection Time: 11/16/15 11:01 PM  Result Value Ref Range   Color, Urine YELLOW YELLOW   APPearance CLEAR CLEAR   Specific Gravity, Urine 1.018 1.005 - 1.030   pH 6.0 5.0 - 8.0   Glucose, UA NEGATIVE NEGATIVE mg/dL   Hgb urine dipstick LARGE (A) NEGATIVE   Bilirubin Urine NEGATIVE NEGATIVE   Ketones, ur NEGATIVE NEGATIVE mg/dL   Protein, ur NEGATIVE NEGATIVE mg/dL   Nitrite NEGATIVE NEGATIVE   Leukocytes, UA NEGATIVE NEGATIVE  Urine microscopic-add on     Status: Abnormal   Collection Time: 11/16/15 11:01 PM  Result Value Ref Range   Squamous Epithelial / LPF 0-5 (A) NONE SEEN   WBC, UA NONE SEEN 0 - 5 WBC/hpf   RBC / HPF 0-5 0 - 5 RBC/hpf   Bacteria, UA RARE (A) NONE SEEN   US Abdomen Limited Ruq  11/16/2015  CLINICAL DATA:  Right upper quadrant pain for 3  days EXAM: US ABDOMEN LIMITED - RIGHT UPPER QUADRANT COMPARISON:  08/10/2015 FINDINGS: Gallbladder: Gallstones including fixed in the neck. There is borderline wall thickening at 3-4 mm but no focal tenderness per sonographer exam and no convincing striation. The gallbladder is distended. Common bile duct: Diameter: 7 mm.  Where visualized, no filling defect. Liver: No focal lesion identified. Within normal limits in parenchymal echogenicity. IMPRESSION: 1. Cholelithiasis including stone fixed in the neck. The gallbladder is distended and could be obstructed. No definite inflammatory features for acute cholecystitis. 2. Dilated common bile duct without visible choledocholithiasis. Electronically Signed   By: Monte Fantasia M.D.   On: 11/16/2015 09:29    Review of Systems  Constitutional: Negative.   HENT: Negative.   Eyes: Negative.   Respiratory: Negative for cough and shortness of breath.   Cardiovascular: Negative for chest pain.  Gastrointestinal: Negative for nausea, vomiting and abdominal pain.  Genitourinary: Negative.   Musculoskeletal: Negative.   Skin: Negative.   Neurological: Negative.   Endo/Heme/Allergies: Negative.   Psychiatric/Behavioral: Negative.     Blood pressure 140/88, pulse 63, temperature 98.4 F (36.9 C), temperature source Oral, resp. rate 20, last menstrual period 11/08/2015, SpO2 99 %, unknown if currently breastfeeding. Physical Exam  Constitutional: She is oriented to person, place, and time. She appears well-developed and well-nourished. No distress.  HENT:  Head: Normocephalic and atraumatic.  Right Ear: External ear normal.  Left Ear: External ear normal.  Nose: Nose normal.  Mouth/Throat: Oropharynx is clear and moist. No oropharyngeal exudate.  Eyes: EOM are normal. Pupils are equal, round, and reactive to light. Right eye exhibits no discharge. Left eye exhibits no discharge.  Neck: Neck supple. No tracheal deviation present.  Cardiovascular: Normal  rate and normal heart sounds.   Respiratory: Effort normal and breath sounds normal. No stridor. No respiratory distress. She has no wheezes. She has no rales.  GI: Soft. She exhibits no distension. There is tenderness. There is no rebound and no guarding.  Right upper quadrant tenderness, no guarding, no generalized tenderness, no peritoneal signs  Musculoskeletal: Normal range of motion.  Neurological: She is alert and oriented to person, place, and time.  Skin: Skin is warm and dry.  Psychiatric: She has a normal mood and affect.     Assessment/Plan Symptomatically  cholelithiasis - admit, IV Rocephin, plan laparoscopic cholecystectomy with possible cholangiogram in the morning. I will discuss her case with Dr. Brantley Stage. I did review the procedure, risks, and benefits with her in detail answered her questions. She is agreeable.  Janele Lague E 11/17/2015, 12:33 AM

## 2015-11-17 NOTE — Interval H&P Note (Signed)
History and Physical Interval Note:  11/17/2015 7:27 AM  Madeline Stark  has presented today for surgery, with the diagnosis of cholecystitis  The various methods of treatment have been discussed with the patient and family. After consideration of risks, benefits and other options for treatment, the patient has consented to  Procedure(s): LAPAROSCOPIC CHOLECYSTECTOMY WITH POSSIBLE INTRAOPERATIVE CHOLANGIOGRAM (N/A) as a surgical intervention .  The patient's history has been reviewed, patient examined, no change in status, stable for surgery.  I have reviewed the patient's chart and labs.  Questions were answered to the patient's satisfaction.    Translator has been used and the patient is well informed.  She has no further questions.  She is ready to proceed.The procedure has been discussed with the patient. Operative and non operative treatments have been discussed. Risks of surgery include bleeding, infection,  Common bile duct injury,  Injury to the stomach,liver, colon,small intestine, abdominal wall,  Diaphragm,  Major blood vessels,  And the need for an open procedure.  Other risks include worsening of medical problems, death,  DVT and pulmonary embolism, and cardiovascular events.   Medical options have also been discussed. The patient has been informed of long term expectations of surgery and non surgical options,  The patient agrees to proceed.     Humphrey Guerreiro A.

## 2015-11-18 MED ORDER — OXYCODONE-ACETAMINOPHEN 5-325 MG PO TABS: 1.0000 | ORAL_TABLET | ORAL | Status: AC | PRN

## 2015-11-18 NOTE — Progress Notes (Signed)
Pt scheduled for discharge home this pm 

## 2015-11-18 NOTE — Progress Notes (Signed)
2mg iv morphine administered for c/o pain 

## 2015-11-18 NOTE — Progress Notes (Signed)
Pt waiting for her ride home. Discharge teaching provided using a Spanish speaking nurse tech. No concerns voiced

## 2015-11-18 NOTE — Discharge Summary (Signed)
   Patient ID: Madeline Stark [id number] 35 y.o. 06/05/1981  11/16/2015  Discharge date and time: 11/18/2015   Admitting Physician: Glenna FellowsHOXWORTH,Kemora Pinard T  Discharge Physician: Glenna FellowsHOXWORTH,Yanky Vanderburg T  Admission Diagnoses: Symptomatic Cholelithiasis cholecystitis  Discharge Diagnoses: Same  Operations: Procedure(s): LAPAROSCOPIC CHOLECYSTECTOMY WITH INTRAOPERATIVE CHOLANGIOGRAM  Admission Condition: fair  Discharged Condition: good  Indication for Admission: patient developed symptomatic gallstones during pregnancy. She is about 6 weeks postpartum. She developed recurrent right upper quadrant abdominal pain which after treatment in the emergency room recurred immediately and she re-presented to the emergency room and was admitted for urgent laparoscopic cholecystectomy.  Hospital Course: the day following admission the patient underwent an uneventful laparoscopic cholecystectomy with intraoperative cholangiogram. The following day she is ambulatory and states she is feeling significantly better. Tolerating a diet. Abdomen is soft and nontender and wounds healing well. She is felt ready for discharge.   Disposition: Home  Patient Instructions:    Medication List    TAKE these medications        ondansetron 4 MG tablet  Commonly known as:  ZOFRAN  Take 1 tablet (4 mg total) by mouth every 8 (eight) hours as needed for nausea or vomiting.     oxyCODONE-acetaminophen 5-325 MG tablet  Commonly known as:  PERCOCET/ROXICET  Take 1 tablet by mouth every 4 (four) hours as needed for moderate pain.     prenatal multivitamin Tabs tablet  Take 1 tablet by mouth daily at 12 noon.        Activity: activity as tolerated Diet: regular diet Wound Care: none needed  [facility]:  [facility]  Signed: Mariella SaaBenjamin T Burns Timson MD, FACS  11/18/2015, 10:06 AM

## 2015-11-18 NOTE — Discharge Instructions (Signed)
[facility] ______[facility] °LAPAROSCOPIC SURGERY: POST OP INSTRUCTIONS °Always review your discharge instruction sheet given to you by the facility where your surgery was performed. °IF YOU HAVE DISABILITY OR FAMILY LEAVE FORMS, YOU MUST BRING THEM TO THE OFFICE FOR PROCESSING.   °DO NOT GIVE THEM TO YOUR DOCTOR. ° °1. A prescription for pain medication may be given to you upon discharge.  Take your pain medication as prescribed, if needed.  If narcotic pain medicine is not needed, then you may take acetaminophen (Tylenol) or ibuprofen (Advil) as needed. °2. Take your usually prescribed medications unless otherwise directed. °3. If you need a refill on your pain medication, please contact your pharmacy.  They will contact our office to request authorization. Prescriptions will not be filled after 5pm or on week-ends. °4. You should follow a light diet the first few days after arrival home, such as soup and crackers, etc.  Be sure to include lots of fluids daily. °5. Most patients will experience some swelling and bruising in the area of the incisions.  Ice packs will help.  Swelling and bruising can take several days to resolve.  °6. It is common to experience some constipation if taking pain medication after surgery.  Increasing fluid intake and taking a stool softener (such as Colace) will usually help or prevent this problem from occurring.  A mild laxative (Milk of Magnesia or Miralax) should be taken according to package instructions if there are no bowel movements after 48 hours. °7. Unless discharge instructions indicate otherwise, you may remove your bandages 24-48 hours after surgery, and you may shower at that time.  You may have steri-strips (small skin tapes) in place directly over the incision.  These strips should be left on the skin for 7-10 days.  If your surgeon used skin glue on the incision, you may shower in 24 hours.  The glue will flake off over the next 2-3 weeks.  Any sutures or  staples will be removed at the office during your follow-up visit. °8. ACTIVITIES:  You may resume regular (light) daily activities beginning the next day--such as daily self-care, walking, climbing stairs--gradually increasing activities as tolerated.  You may have sexual intercourse when it is comfortable.  Refrain from any heavy lifting or straining until approved by your doctor. °a. You may drive when you are no longer taking prescription pain medication, you can comfortably wear a seatbelt, and you can safely maneuver your car and apply brakes. °b. RETURN TO WORK:  __________________________________________________________ °9. You should see your doctor in the office for a follow-up appointment approximately 2-3 weeks after your surgery.  Make sure that you call for this appointment within a day or two after you arrive home to insure a convenient appointment time. °10. OTHER INSTRUCTIONS: __________________________________________________________________________________________________________________________ __________________________________________________________________________________________________________________________ °WHEN TO CALL YOUR DOCTOR: °1. Fever over 101.0 °2. Inability to urinate °3. Continued bleeding from incision. °4. Increased pain, redness, or drainage from the incision. °5. Increasing abdominal pain ° °The clinic staff is available to answer your questions during regular business hours.  Please don’t hesitate to call and ask to speak to one of the nurses for clinical concerns.  If you have a medical emergency, go to the nearest emergency room or call 911.  A surgeon from [facility] is always on call at the hospital. °[address], [address], [place], [place]  [address] ? [address] [address], [place],    [address] °[phone] ? [phone] ? FAX [phone] °Web site:   [email or web address] °

## 2015-11-18 NOTE — Progress Notes (Signed)
Patient ID: Madeline Stark, female   DOB: [birth year], 35 y.o.   MRN: [id number] 1 Day Post-Op  Subjective: No complaints today. Feels better. Tolerating diet. Ambulatory. Feels ready to go home.  Objective: Vital signs in last 24 hours: Temp:  [97.2 F (36.2 C)-98.3 F (36.8 C)] 98 F (36.7 C) (01/07 0436) Pulse Rate:  [60-100] 91 (01/07 0436) Resp:  [18-20] 18 (01/07 0436) BP: (101-121)/(63-75) 101/69 mmHg (01/07 0436) SpO2:  [98 %-100 %] 98 % (01/07 0436)    Intake/Output from previous day: 01/06 0701 - 01/07 0700 In: 1970 [P.O.:720; I.V.:1250] Out: 1650 [Urine:1600; Blood:50] Intake/Output this shift:    General appearance: alert, cooperative and no distress GI: normal findings: soft, non-tender Incision/Wound: no erythema or drainage  Lab Results:   Recent Labs  11/16/15 0705 11/16/15 2254  WBC 9.1 8.0  HGB 12.6 12.4  HCT 39.2 39.7  PLT 378 403*   BMET  Recent Labs  11/16/15 0705 11/16/15 2254  NA 137 139  K 3.8 3.9  CL 107 104  CO2 22 27  GLUCOSE 102* 105*  BUN 11 11  CREATININE 0.59 0.80  CALCIUM 8.9 9.4     Studies/Results: Dg Cholangiogram Operative  11/17/2015  CLINICAL DATA:  Gallstones EXAM: INTRAOPERATIVE CHOLANGIOGRAM TECHNIQUE: Cholangiographic images from the C-arm fluoroscopic device were submitted for interpretation post-operatively. Please see the procedural report for the amount of contrast and the fluoroscopy time utilized. COMPARISON:  None. FINDINGS: Contrast fills the biliary tree and duodenum without evidence of filling defect in the common bile duct. IMPRESSION: Patent biliary tree. Electronically Signed   By: Jolaine ClickArthur  Hoss M.D.   On: 11/17/2015 13:11    Anti-infectives: Anti-infectives    Start     Dose/Rate Route Frequency Ordered Stop   11/17/15 0200  cefTRIAXone (ROCEPHIN) 2 g in dextrose 5 % 50 mL IVPB     2 g 100 mL/hr over 30 Minutes Intravenous Daily at bedtime 11/17/15 0137        Assessment/Plan: s/p  Procedure(s): LAPAROSCOPIC CHOLECYSTECTOMY WITH INTRAOPERATIVE CHOLANGIOGRAM Doing well postoperatively without apparent complication Okay for discharge.   LOS: 1 day    Madeline Stark T 11/18/2015

## 2015-11-20 ENCOUNTER — Encounter (HOSPITAL_COMMUNITY): Payer: Self-pay | Admitting: Surgery

## 2016-10-08 IMAGING — US US MFM FETAL NUCHAL TRANSLUCENCY
1 series · 13 of 28 positions shown · non-contrast
Comparison: none

[Series 1: us mfm fetal nuchal translucency · 0.16mm/px · 13 of 32 slices shown]
[im 2/32]
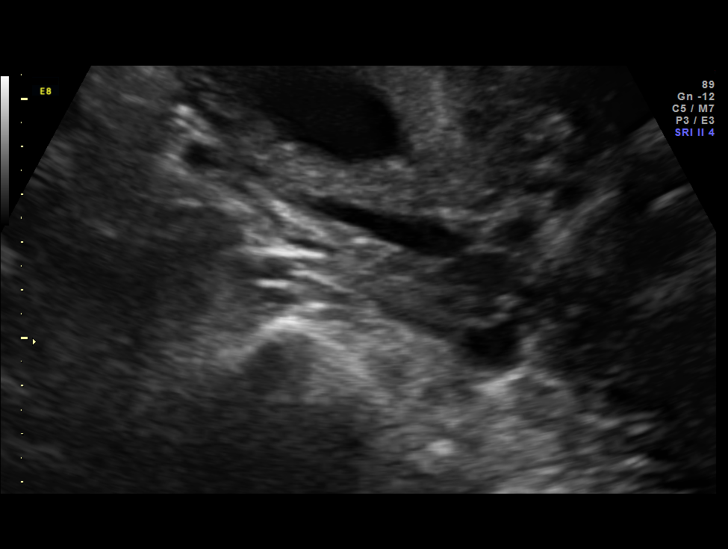
[im 4/32]
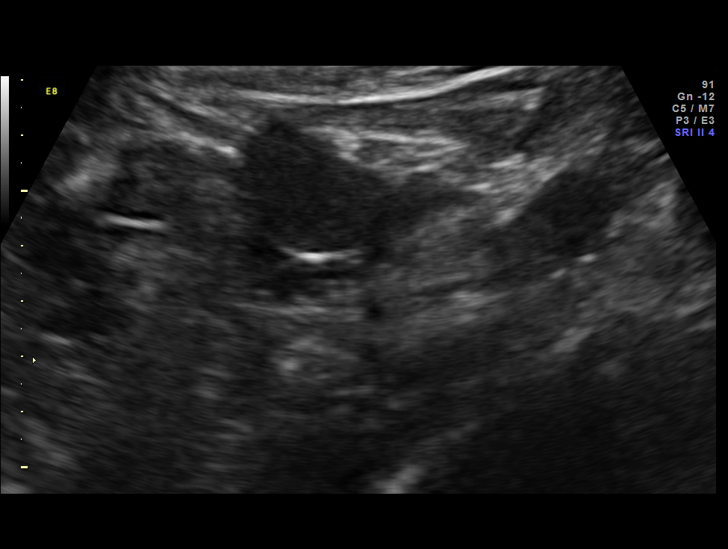
[im 6/32]
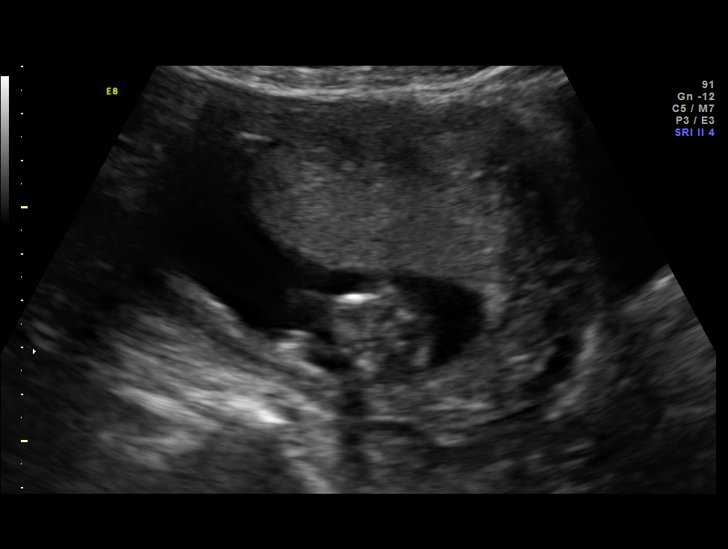
[im 9/32]
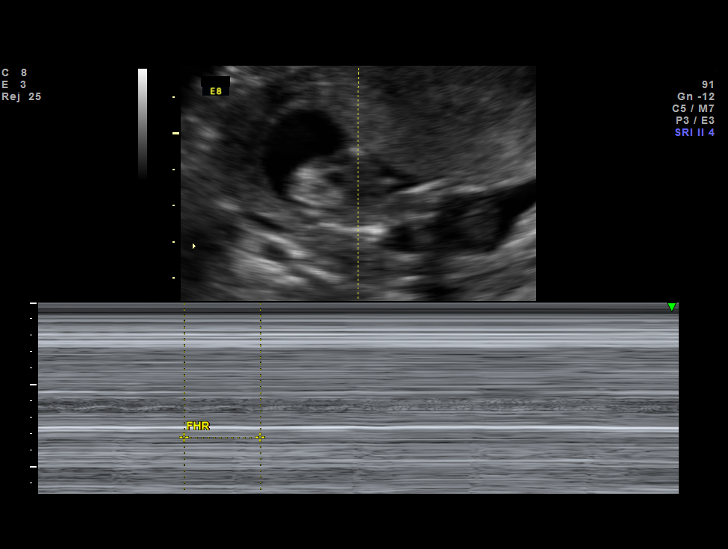
[im 11/32]
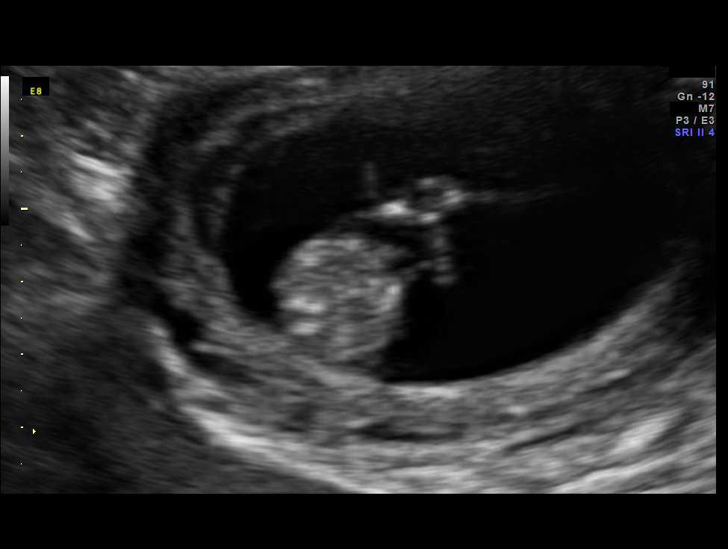
[im 13/32]
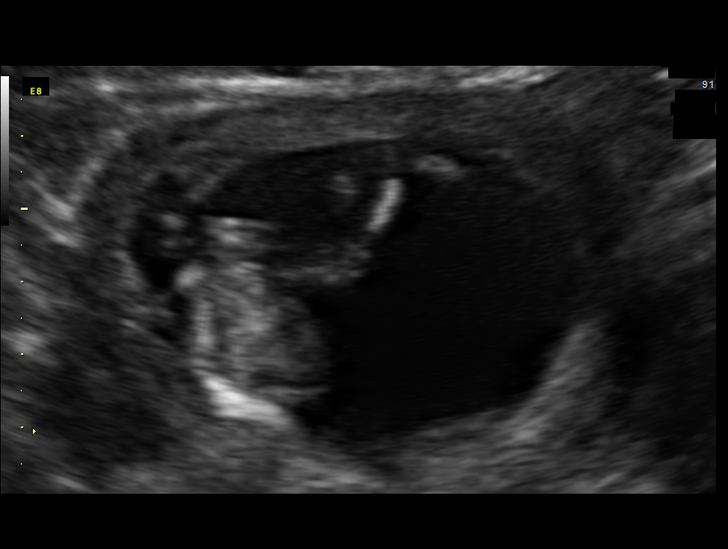
[im 17/32]
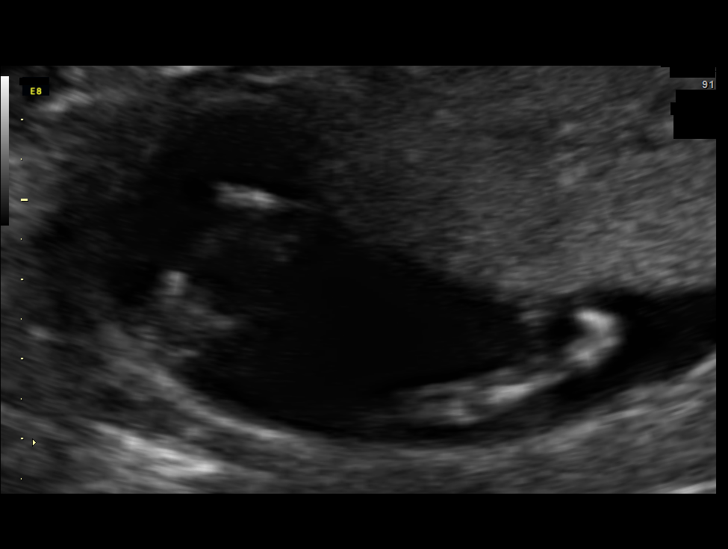
[im 19/32]
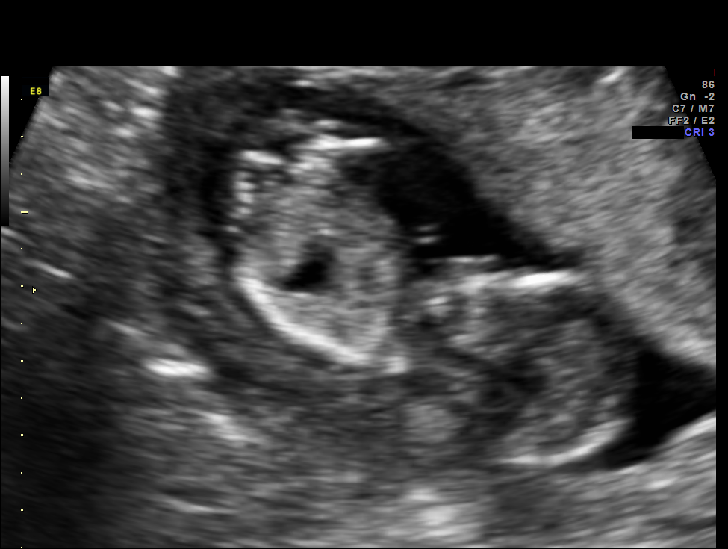
[im 21/32]
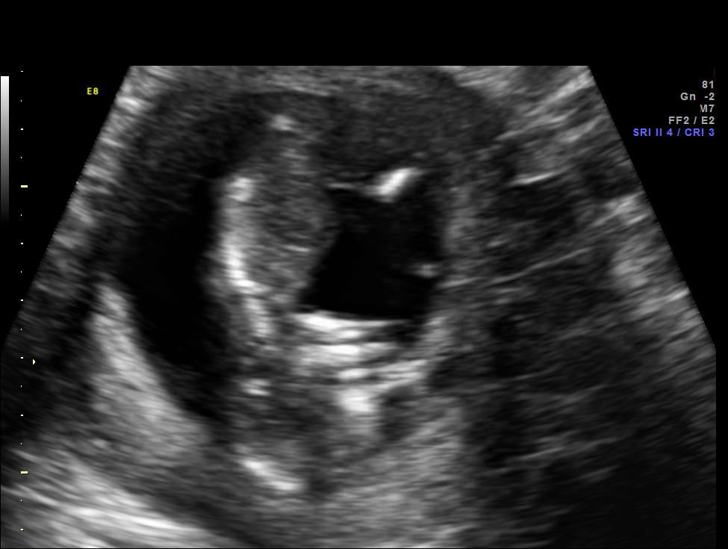
[im 23/32]
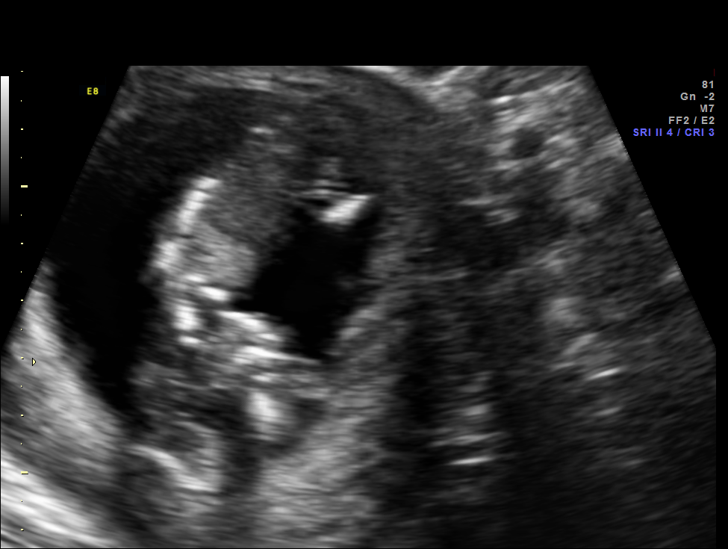
[im 26/32]
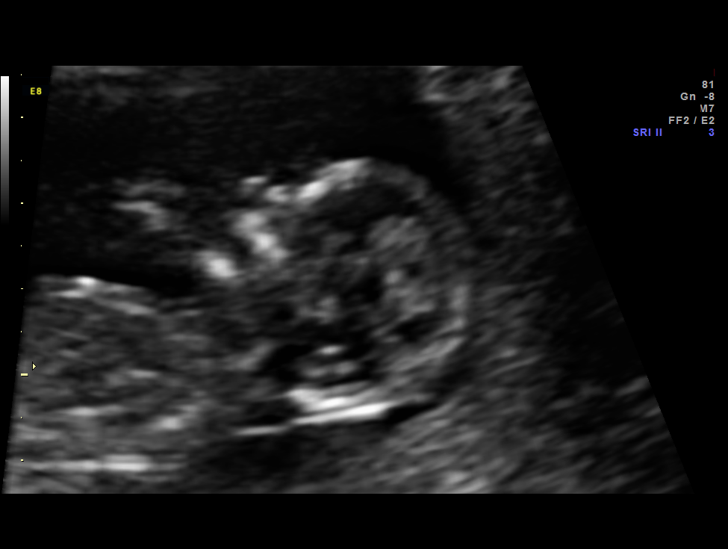
[im 28/32]
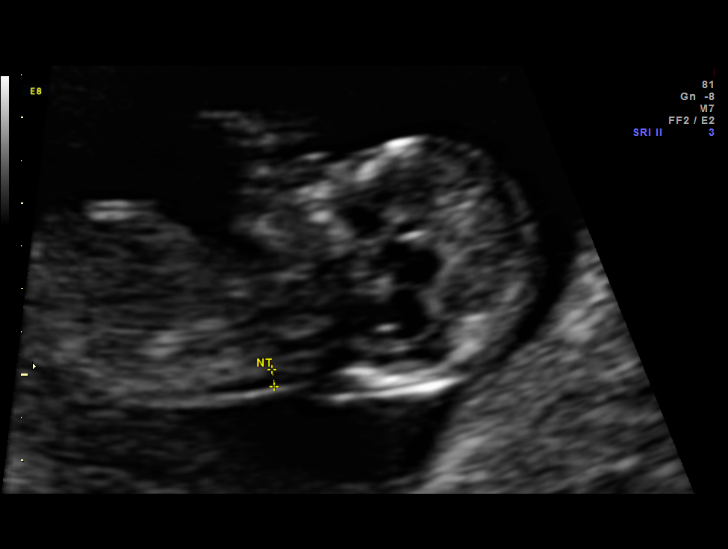
[im 30/32]
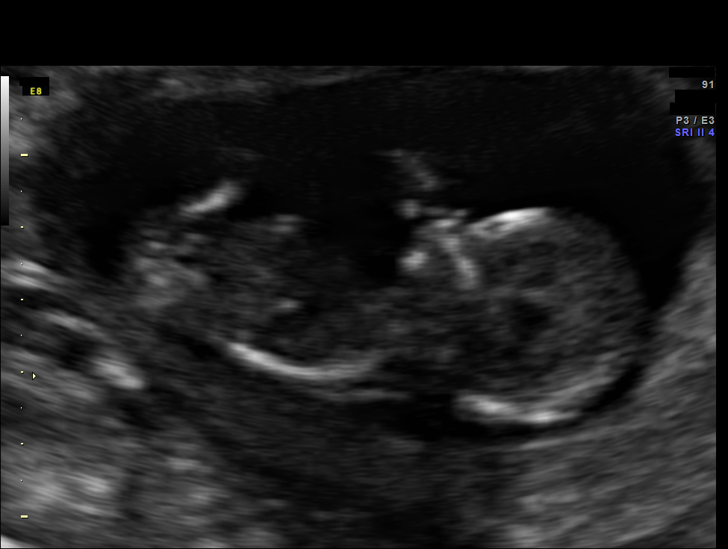

[13 of 28 positions shown; findings below may reference images not displayed]

OBSTETRICS REPORT
(Signed Final 04/21/2015 [DATE])

Name:       KAKI JIM                    Visit  04/18/2015 [DATE]
Date:

Service(s) Provided

Indications

First trimester aneuploidy screen (NT)                 Z36
Advanced maternal age multigravida 35+, first
trimester
Previous cesarean section x 2
Advanced paternal age (ARREOLA)
13 weeks gestation of pregnancy
Fetal Evaluation

Num Of             1
Fetuses:
Cardiac Activity:  Observed
Presentation:      Cephalic
Placenta:          Anterior, above cervical
os
P. Cord            Visualized
Insertion:

Amniotic Fluid
AFI FV:      Subjectively within normal limits
Gestational Age

LMP:           13w 5d        Date:  01/12/15                  EDD:   10/19/15
Best:          13w 5d    Det. By:   LMP  (01/12/15)           EDD:   10/19/15
1st Trimester Genetic Sonogram Screening

CRL:            72.6  mm     G. Age:   13w 1d                 EDD:   10/23/15
Nuc Trans:       2.0  mm

Nasal Bone:                  Present
Cervix Uterus Adnexa

Cervix:       Normal appearance by transabdominal scan.
Appears closed, without funnelling.
Left Ovary:    Within normal limits.
Right Ovary:   Within normal limits.
Impression

SIUP at 13+5 weeks
No gross abnormalities identified
NT measurement was within normal limits for this GA; NB
present
Normal amniotic fluid volume
Measurements consistent with LMP dating
Recommendations

Follow-up ultrasound in 5-6 weeks for detailed anatomic
survey
